# Patient Record
Sex: Female | Born: 1945 | Race: White | Hispanic: No | Marital: Married | Smoking: Never smoker
Health system: Southern US, Community
[De-identification: ages and names within clinical notes are randomized; demographics above are authoritative.]

## PROBLEM LIST (undated history)

## (undated) DIAGNOSIS — F329 Major depressive disorder, single episode, unspecified: Secondary | ICD-10-CM

## (undated) DIAGNOSIS — F32A Depression, unspecified: Secondary | ICD-10-CM

## (undated) DIAGNOSIS — T7840XA Allergy, unspecified, initial encounter: Secondary | ICD-10-CM

## (undated) DIAGNOSIS — E785 Hyperlipidemia, unspecified: Secondary | ICD-10-CM

## (undated) DIAGNOSIS — I10 Essential (primary) hypertension: Secondary | ICD-10-CM

## (undated) HISTORY — DX: Essential (primary) hypertension: I10

## (undated) HISTORY — DX: Depression, unspecified: F32.A

## (undated) HISTORY — DX: Hyperlipidemia, unspecified: E78.5

## (undated) HISTORY — DX: Allergy, unspecified, initial encounter: T78.40XA

## (undated) HISTORY — DX: Major depressive disorder, single episode, unspecified: F32.9

---

## 2006-12-18 HISTORY — PX: CATARACT EXTRACTION W/ INTRAOCULAR LENS  IMPLANT, BILATERAL: SHX1307

## 2008-01-10 ENCOUNTER — Encounter: Admission: RE | Admit: 2008-01-10 | Discharge: 2008-01-10 | Payer: Self-pay | Admitting: Internal Medicine

## 2008-01-14 ENCOUNTER — Ambulatory Visit: Payer: Self-pay | Admitting: Internal Medicine

## 2008-01-14 DIAGNOSIS — B351 Tinea unguium: Secondary | ICD-10-CM

## 2008-01-14 DIAGNOSIS — J069 Acute upper respiratory infection, unspecified: Secondary | ICD-10-CM | POA: Insufficient documentation

## 2008-01-14 DIAGNOSIS — I10 Essential (primary) hypertension: Secondary | ICD-10-CM

## 2008-01-14 DIAGNOSIS — N924 Excessive bleeding in the premenopausal period: Secondary | ICD-10-CM | POA: Insufficient documentation

## 2008-01-14 DIAGNOSIS — F4321 Adjustment disorder with depressed mood: Secondary | ICD-10-CM | POA: Insufficient documentation

## 2008-01-15 ENCOUNTER — Encounter (INDEPENDENT_AMBULATORY_CARE_PROVIDER_SITE_OTHER): Payer: Self-pay | Admitting: *Deleted

## 2008-03-04 ENCOUNTER — Ambulatory Visit: Payer: Self-pay | Admitting: Internal Medicine

## 2008-03-04 ENCOUNTER — Ambulatory Visit: Payer: Self-pay | Admitting: Gastroenterology

## 2008-03-09 ENCOUNTER — Encounter: Payer: Self-pay | Admitting: Internal Medicine

## 2008-03-17 LAB — CONVERTED CEMR LAB
AST: 16 units/L (ref 0–37)
Alkaline Phosphatase: 55 units/L (ref 39–117)
BUN: 19 mg/dL (ref 6–23)
Bilirubin, Direct: 0.1 mg/dL (ref 0.0–0.3)
CO2: 31 meq/L (ref 19–32)
Chloride: 106 meq/L (ref 96–112)
Eosinophils Relative: 3.3 % (ref 0.0–5.0)
GFR calc Af Amer: 73 mL/min
Glucose, Bld: 97 mg/dL (ref 70–99)
HDL: 33.6 mg/dL — ABNORMAL LOW (ref >39.0)
LDL Cholesterol: 102 mg/dL — ABNORMAL HIGH (ref 0–99)
Lymphocytes Relative: 32.9 % (ref 12.0–46.0)
Monocytes Absolute: 0.6 10*3/uL (ref 0.2–0.7)
Monocytes Relative: 7.5 % (ref 3.0–11.0)
Neutrophils Relative %: 55.7 % (ref 43.0–77.0)
Platelets: 246 10*3/uL (ref 150–400)
Potassium: 4.2 meq/L (ref 3.5–5.1)
RDW: 12.6 % (ref 11.5–14.6)
Sodium: 142 meq/L (ref 135–145)
Total CHOL/HDL Ratio: 5
Total Protein: 7.1 g/dL (ref 6.0–8.3)
Triglycerides: 159 mg/dL — ABNORMAL HIGH (ref 0–149)
VLDL: 32 mg/dL (ref 0–40)
WBC: 7.6 10*3/uL (ref 4.5–10.5)

## 2008-03-18 ENCOUNTER — Ambulatory Visit: Payer: Self-pay | Admitting: Gastroenterology

## 2008-04-14 ENCOUNTER — Ambulatory Visit: Payer: Self-pay | Admitting: Internal Medicine

## 2008-04-14 DIAGNOSIS — R635 Abnormal weight gain: Secondary | ICD-10-CM | POA: Insufficient documentation

## 2008-04-22 ENCOUNTER — Telehealth: Payer: Self-pay | Admitting: Internal Medicine

## 2008-05-25 ENCOUNTER — Ambulatory Visit: Payer: Self-pay | Admitting: Internal Medicine

## 2008-05-25 DIAGNOSIS — H6120 Impacted cerumen, unspecified ear: Secondary | ICD-10-CM

## 2008-05-25 DIAGNOSIS — H60399 Other infective otitis externa, unspecified ear: Secondary | ICD-10-CM | POA: Insufficient documentation

## 2008-09-15 ENCOUNTER — Telehealth: Payer: Self-pay | Admitting: Internal Medicine

## 2008-09-15 ENCOUNTER — Ambulatory Visit: Payer: Self-pay | Admitting: Internal Medicine

## 2008-09-15 DIAGNOSIS — E669 Obesity, unspecified: Secondary | ICD-10-CM

## 2008-09-21 LAB — CONVERTED CEMR LAB
BUN: 12 mg/dL (ref 6–23)
CO2: 26 meq/L (ref 19–32)
Calcium: 9.5 mg/dL (ref 8.4–10.5)
Glucose, Bld: 87 mg/dL (ref 70–99)
Sodium: 141 meq/L (ref 135–145)

## 2009-01-11 ENCOUNTER — Encounter: Admission: RE | Admit: 2009-01-11 | Discharge: 2009-01-11 | Payer: Self-pay | Admitting: Obstetrics and Gynecology

## 2009-01-14 ENCOUNTER — Encounter: Admission: RE | Admit: 2009-01-14 | Discharge: 2009-01-14 | Payer: Self-pay | Admitting: Obstetrics and Gynecology

## 2009-03-16 ENCOUNTER — Ambulatory Visit: Payer: Self-pay | Admitting: Internal Medicine

## 2009-03-16 LAB — CONVERTED CEMR LAB
CO2: 30 meq/L (ref 19–32)
Chloride: 110 meq/L (ref 96–112)
Creatinine, Ser: 0.8 mg/dL (ref 0.4–1.2)
Glucose, Bld: 91 mg/dL (ref 70–99)
Sodium: 145 meq/L (ref 135–145)
TSH: 2.73 microintl units/mL (ref 0.35–5.50)

## 2009-09-20 ENCOUNTER — Ambulatory Visit: Payer: Self-pay | Admitting: Internal Medicine

## 2009-09-21 LAB — CONVERTED CEMR LAB
ALT: 15 units/L (ref 0–35)
AST: 17 units/L (ref 0–37)
Albumin: 4.1 g/dL (ref 3.5–5.2)
Alkaline Phosphatase: 59 units/L (ref 39–117)
BUN: 16 mg/dL (ref 6–23)
Bilirubin, Direct: 0.1 mg/dL (ref 0.0–0.3)
CO2: 28 meq/L (ref 19–32)
Calcium: 9.4 mg/dL (ref 8.4–10.5)
Chloride: 110 meq/L (ref 96–112)
Creatinine, Ser: 0.9 mg/dL (ref 0.4–1.2)
GFR calc non Af Amer: 67.34 mL/min (ref >60)
Glucose, Bld: 88 mg/dL (ref 70–99)
Potassium: 4.3 meq/L (ref 3.5–5.1)
Sodium: 143 meq/L (ref 135–145)
TSH: 1.78 microintl units/mL (ref 0.35–5.50)
Total Bilirubin: 1.1 mg/dL (ref 0.3–1.2)
Total Protein: 8.4 g/dL — ABNORMAL HIGH (ref 6.0–8.3)

## 2009-10-04 ENCOUNTER — Telehealth: Payer: Self-pay | Admitting: Internal Medicine

## 2010-01-17 ENCOUNTER — Encounter: Admission: RE | Admit: 2010-01-17 | Discharge: 2010-01-17 | Payer: Self-pay | Admitting: Internal Medicine

## 2010-03-21 ENCOUNTER — Ambulatory Visit: Payer: Self-pay | Admitting: Internal Medicine

## 2010-09-13 ENCOUNTER — Ambulatory Visit: Payer: Self-pay | Admitting: Internal Medicine

## 2010-09-13 LAB — CONVERTED CEMR LAB
ALT: 14 units/L (ref 0–35)
BUN: 20 mg/dL (ref 6–23)
Bilirubin Urine: NEGATIVE
CO2: 27 meq/L (ref 19–32)
Chloride: 108 meq/L (ref 96–112)
Eosinophils Relative: 2.6 % (ref 0.0–5.0)
Glucose, Bld: 87 mg/dL (ref 70–99)
HCT: 40.5 % (ref 36.0–46.0)
Hemoglobin, Urine: NEGATIVE
Leukocytes, UA: NEGATIVE
Lymphs Abs: 2.2 10*3/uL (ref 0.7–4.0)
MCV: 94.3 fL (ref 78.0–100.0)
Monocytes Absolute: 0.6 10*3/uL (ref 0.1–1.0)
Nitrite: NEGATIVE
Platelets: 240 10*3/uL (ref 150.0–400.0)
Potassium: 4.7 meq/L (ref 3.5–5.1)
RDW: 13.3 % (ref 11.5–14.6)
TSH: 2.46 microintl units/mL (ref 0.35–5.50)
Total Bilirubin: 0.8 mg/dL (ref 0.3–1.2)
Urobilinogen, UA: 0.2 (ref 0.0–1.0)
WBC: 8.4 10*3/uL (ref 4.5–10.5)
pH: 6 (ref 5.0–8.0)

## 2010-09-20 ENCOUNTER — Encounter: Payer: Self-pay | Admitting: Internal Medicine

## 2010-09-20 ENCOUNTER — Ambulatory Visit: Payer: Self-pay | Admitting: Internal Medicine

## 2010-12-15 ENCOUNTER — Ambulatory Visit: Payer: Self-pay | Admitting: Internal Medicine

## 2010-12-17 LAB — CONVERTED CEMR LAB
BUN: 15 mg/dL (ref 6–23)
Calcium: 9 mg/dL (ref 8.4–10.5)
Cholesterol: 161 mg/dL (ref 0–200)
Creatinine, Ser: 0.9 mg/dL (ref 0.4–1.2)
GFR calc non Af Amer: 66.22 mL/min (ref >60.00)
LDL Cholesterol: 115 mg/dL — ABNORMAL HIGH (ref 0–99)
Potassium: 4.2 meq/L (ref 3.5–5.1)
Total Bilirubin: 0.8 mg/dL (ref 0.3–1.2)
Triglycerides: 72 mg/dL (ref 0.0–149.0)
VLDL: 14.4 mg/dL (ref 0.0–40.0)

## 2010-12-22 ENCOUNTER — Ambulatory Visit
Admission: RE | Admit: 2010-12-22 | Discharge: 2010-12-22 | Payer: Self-pay | Source: Home / Self Care | Attending: Internal Medicine | Admitting: Internal Medicine

## 2011-01-07 ENCOUNTER — Other Ambulatory Visit: Payer: Self-pay | Admitting: Obstetrics and Gynecology

## 2011-01-07 DIAGNOSIS — Z0000 Encounter for general adult medical examination without abnormal findings: Secondary | ICD-10-CM

## 2011-01-17 NOTE — Assessment & Plan Note (Signed)
Summary: 6 MTH FU  STC   Vital Signs:  Patient profile:   65 year old female Height:      66 inches Weight:      190 pounds BMI:     30.78 Temp:     98.1 degrees F oral Pulse rate:   64 / minute BP sitting:   132 / 88  (left arm)  Vitals Entered By: Lamar Sprinkles, CMA (March 21, 2010 8:12 AM) CC: 6 mth f/u    CC:  6 mth f/u .  History of Present Illness: F/u HTN  Current Medications (verified): 1)  Norvasc 5 Mg Tabs (Amlodipine Besylate) .Marland Kitchen.. 1 Tab  By Mouth Once Daily 2)  Penlac 8 %  Soln (Ciclopirox) .... Use Once Daily X 12 Mo 3)  Ecotrin 325 Mg Tbec (Aspirin) .... Once Daily 4)  Ketoconazole 2 % Crea (Ketoconazole) .... Apply To Foot Qd  Allergies (verified): 1)  ! Penicillin V Potassium (Penicillin V Potassium) 2)  ! 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    Physical Exam  General:  Well-developed,well-nourished,in no acute distress; alert,appropriate and cooperative throughout examination Mouth:  Oral mucosa and oropharynx without lesions or exudates.  Teeth in good repair. Lungs:  Normal respiratory effort, chest expands symmetrically. Lungs are clear to auscultation, no crackles or wheezes. Heart:  Normal rate and regular rhythm. S1 and S2 normal without gallop, murmur, click, rub or other extra sounds. Abdomen:  Bowel sounds positive,abdomen soft and non-tender without masses, organomegaly or hernias noted. Msk:  No deformity or scoliosis noted of thoracic or lumbar spine.   Extremities:  No clubbing, cyanosis, edema, or deformity noted with normal full range of motion of all joints.   Neurologic:  No cranial nerve deficits noted. Station and gait are normal. Plantar reflexes are  down-going bilaterally. DTRs are symmetrical throughout. Sensory, motor and coordinative functions appear intact.   Impression & Recommendations:  Problem # 1:  HYPERTENSION (ICD-401.9) Assessment Improved Check BP at home. Call me if up Her updated medication list for this problem includes:    Norvasc 5 Mg Tabs (Amlodipine besylate) .Marland Kitchen... 1 tab  by mouth once daily  Problem # 2:  MENOPAUSAL SYNDROME (ICD-627.0) Assessment: Improved  Complete Medication List: 1)  Norvasc 5 Mg Tabs (Amlodipine besylate) .Marland Kitchen.. 1 tab  by mouth once daily 2)  Penlac 8 % Soln (Ciclopirox) .... Use once daily x 12 mo 3)  Ecotrin 325 Mg Tbec (Aspirin) .... Once daily 4)  Ketoconazole 2 % Crea (Ketoconazole) .... Apply to foot qd 5)  Vitamin D 1000 Unit Tabs (Cholecalciferol) .Marland Kitchen.. 1 by mouth qd  Patient Instructions: 1)  Please schedule a follow-up appointment in 6 months well w/labs.

## 2011-01-17 NOTE — Assessment & Plan Note (Signed)
Summary: 6 MTH PHYSICAL--STC   Vital Signs:  Patient profile:   65 year old female Height:      66 inches Weight:      188 pounds BMI:     30.45 Temp:     97.4 degrees F oral Pulse rate:   64 / minute Pulse rhythm:   regular BP sitting:   140 / 90  (left arm) Cuff size:   regular  Vitals Entered By: Lanier Prude, Beverly Gust) (September 20, 2010 8:32 AM) CC: CPX Is Patient Diabetic? No Comments pt is not taking Vit D   CC:  CPX.  History of Present Illness: The patient presents for a preventive health examination  C/o R earrache x 2 wks C/o URI  Current Medications (verified): 1)  Norvasc 5 Mg Tabs (Amlodipine Besylate) .Marland Kitchen.. 1 Tab  By Mouth Once Daily 2)  Penlac 8 %  Soln (Ciclopirox) .... Use Once Daily X 12 Mo 3)  Ecotrin 325 Mg Tbec (Aspirin) .... Once Daily 4)  Ketoconazole 2 % Crea (Ketoconazole) .... Apply To Foot Qd 5)  Vitamin D 1000 Unit Tabs (Cholecalciferol) .Marland Kitchen.. 1 By Mouth Qd  Allergies (verified): 1)  ! Penicillin V Potassium (Penicillin V Potassium) 2)  ! Sulfadiazine (Sulfadiazine)  Past History:  Past Surgical History: Last updated: 01/14/2008 B lense impl. 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         Sweats  Physical Exam  General:  Well-developed,well-nourished,in no acute distress; alert,appropriate and cooperative throughout examination Head:  Normocephalic and atraumatic without obvious abnormalities. No apparent alopecia or balding. Eyes:  No corneal or conjunctival inflammation noted. EOMI. Perrla. Funduscopic exam benign, without hemorrhages, exudates or papilledema. Vision grossly normal. Ears:  L nl R wax Mouth:  Erythematous throat and intranasal mucosa c/w URI   Neck:  No deformities, masses, or tenderness noted. Lungs:  Normal respiratory effort, chest expands symmetrically. Lungs are clear to auscultation, no crackles or wheezes. Heart:  Normal rate and regular rhythm. S1 and S2 normal without gallop, murmur, click, rub or other extra sounds. Abdomen:  Bowel sounds positive,abdomen soft and non-tender without masses, organomegaly or hernias noted. Msk:  No deformity or scoliosis noted of thoracic or lumbar spine.   Pulses:  R and L carotid,radial,femoral,dorsalis pedis and posterior tibial pulses are full and equal bilaterally Extremities:  No clubbing, cyanosis, edema, or deformity noted with normal full range of motion of all joints.   Neurologic:  No cranial nerve deficits noted. Station and gait are normal. Plantar reflexes are down-going bilaterally. DTRs are symmetrical throughout. Sensory, motor and coordinative functions appear intact. Skin:  Intact without suspicious lesions or rashes Cervical Nodes:  No lymphadenopathy noted Inguinal Nodes:  No significant adenopathy Psych:  Cognition and judgment appear intact. Alert and cooperative with normal attention span and concentration. No apparent delusions, illusions, hallucinations   Impression & Recommendations:  Problem # 1:  WELL ADULT EXAM (ICD-V70.0) Assessment New Health and age related issues were discussed. Available screening tests and vaccinations were discussed as well. Healthy life style  including good diet and exercise was discussed.  The labs were reviewed with the patient.  Orders: EKG w/ Interpretation (93000)OK  Problem # 2:  HYPERTENSION (ICD-401.9) Assessment: Deteriorated  Her updated medication list for this problem includes:    Norvasc 5 Mg Tabs (Amlodipine besylate) .Marland Kitchen... 1 tab  by mouth once daily    Losartan Potassium 100 Mg Tabs (Losartan potassium) .Marland Kitchen... 1 by mouth once daily for blood pressure  Problem # 3:  MENOPAUSAL SYNDROME (ICD-627.0) Assessment: Unchanged  Problem # 4:  CERUMEN IMPACTION (ICD-380.4) Assessment: Comment Only She will irrigate at home  Problem # 5:  URI Assessment: New Zpac if worse  Complete Medication List: 1)  Norvasc 5 Mg Tabs (Amlodipine besylate) .Marland Kitchen.. 1 tab  by mouth once daily 2)  Penlac 8 % Soln (Ciclopirox) .... Use once daily x 12 mo 3)  Ecotrin 325 Mg Tbec (Aspirin) .... Once daily 4)  Ketoconazole 2 % Crea (Ketoconazole) .... Apply to foot qd 5)  Vitamin D 1000 Unit Tabs (Cholecalciferol) .Marland Kitchen.. 1 by mouth qd 6)  Lipitor 10 Mg Tabs (Atorvastatin calcium) .Marland Kitchen.. 1 by mouth once daily for cholesterol 7)  Zithromax Z-pak 250 Mg Tabs (Azithromycin) .... As dirrected 8)  Cortisporin 3.5-10000-1 Soln (Neomycin-polymyxin-hc) .... 3 gtt in r ear tid 9)  Losartan Potassium 100 Mg Tabs (Losartan potassium) .Marland Kitchen.. 1 by mouth once daily for blood pressure  Patient Instructions: 1)  Please schedule a follow-up appointment in 3 months. 2)  BMP prior to visit, ICD-9: 3)  Hepatic Panel prior to visit, ICD-9:272.0 401.1 4)  Lipid Panel prior to visit, ICD-9: Prescriptions: LOSARTAN POTASSIUM 100 MG TABS (LOSARTAN POTASSIUM) 1 by mouth once daily for blood pressure  #30 x 11   Entered and Authorized by:   Tresa Garter MD   Signed by:   Tresa Garter MD on 09/20/2010   Method used:   Print then Give to Patient   RxID:   [id number] CORTISPORIN 3.5-10000-1 SOLN (NEOMYCIN-POLYMYXIN-HC) 3 gtt in R ear tid  #1 x 1    Entered and Authorized by:   Tresa Garter MD   Signed by:   Tresa Garter MD on 09/20/2010   Method used:   Print then Give to Patient   RxID:   [id number] Z-PAK 250 MG TABS (AZITHROMYCIN) as dirrected  #1 x 0   Entered and Authorized by:   Tresa Garter MD   Signed by:   Tresa Garter MD on 09/20/2010   Method used:   Print then Give to Patient   RxID:   [id number] LIPITOR 10 MG TABS (ATORVASTATIN CALCIUM) 1 by mouth once daily for cholesterol  #30 x 12   Entered and Authorized by:   Tresa Garter MD   Signed by:   Tresa Garter MD on 09/20/2010   Method used:   Print then Give to Patient   RxID:   [id number]

## 2011-01-18 ENCOUNTER — Ambulatory Visit
Admission: RE | Admit: 2011-01-18 | Discharge: 2011-01-18 | Disposition: A | Discharge status: Home / Self Care | Payer: 59 | Source: Ambulatory Visit | Attending: Obstetrics and Gynecology | Admitting: Obstetrics and Gynecology

## 2011-01-18 DIAGNOSIS — Z0000 Encounter for general adult medical examination without abnormal findings: Secondary | ICD-10-CM

## 2011-01-19 NOTE — Assessment & Plan Note (Signed)
Summary: 3 MO ROV /NWS  #   Vital Signs:  Patient profile:   65 year old female Height:      66 inches Weight:      160 pounds BMI:     25.92 Temp:     98.3 degrees F oral Pulse rate:   72 / minute Pulse rhythm:   regular Resp:     16 per minute BP sitting:   124 / 80  (left arm) Cuff size:   regular  Vitals Entered By: Lanier Prude, CMA(AAMA) (December 22, 2010 8:44 AM) CC: 3 mo f/u  Is Patient Diabetic? No Comments pt is not taking Lipitor or Losartan   CC:  3 mo f/u .  History of Present Illness: The patient presents for a follow up of hypertension, obesity, hyperlipidemia   Current Medications (verified): 1)  Norvasc 5 Mg Tabs (Amlodipine Besylate) .Marland Kitchen.. 1 Tab  By Mouth Once Daily 2)  Penlac 8 %  Soln (Ciclopirox) .... Use Once Daily X 12 Mo 3)  Ecotrin 325 Mg Tbec (Aspirin) .... Once Daily 4)  Ketoconazole 2 % Crea (Ketoconazole) .... Apply To Foot Qd 5)  Vitamin D 1000 Unit Tabs (Cholecalciferol) .Marland Kitchen.. 1 By Mouth Qd 6)  Lipitor 10 Mg Tabs (Atorvastatin Calcium) .Marland Kitchen.. 1 By Mouth Once Daily For Cholesterol 7)  Losartan Potassium 100 Mg Tabs (Losartan Potassium) .Marland Kitchen.. 1 By Mouth Once Daily For Blood Pressure  Allergies (verified): 1)  ! Penicillin V Potassium (Penicillin V Potassium) 2)  ! 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         lost wt on diet  Physical Exam  General:  Well-developed,well-nourished,in no acute distress; alert,appropriate and cooperative throughout examination Mouth:  Erythematous throat and intranasal mucosa c/w URI   Neck:  No deformities, masses, or tenderness noted. Lungs:  Normal respiratory effort, chest expands symmetrically. Lungs are  clear to auscultation, no crackles or wheezes. Heart:  Normal rate and regular rhythm. S1 and S2 normal without gallop, murmur, click, rub or other extra sounds. Abdomen:  Bowel sounds positive,abdomen soft and non-tender without masses, organomegaly or hernias noted. Msk:  No deformity or scoliosis noted of thoracic or lumbar spine.   Extremities:  No clubbing, cyanosis, edema, or deformity noted with normal full range of motion of all joints.   Neurologic:  No cranial nerve deficits noted. Station and gait are normal. Plantar reflexes are down-going bilaterally. DTRs are symmetrical throughout. Sensory, motor and coordinative functions appear intact. Skin:  Intact without suspicious lesions or rashes Psych:  Cognition and judgment appear intact. Alert and cooperative with normal attention span and concentration. No apparent delusions, illusions, hallucinations   Impression & Recommendations:  Problem # 1:  HYPERLIPIDEMIA (ICD-272.4) Assessment Improved  Her updated medication list for this problem includes:    Lipitor 10 Mg Tabs (Atorvastatin calcium) .Marland Kitchen... 1 by mouth once daily for cholesterol  Problem # 2:  DEPRESSION (ICD-311) Assessment: Improved  Problem # 3:  OBESITY (ICD-278.00) Assessment: Improved Cont w/good diet  Complete Medication List: 1)  Norvasc 5 Mg Tabs (Amlodipine besylate) .Marland Kitchen.. 1 tab  by mouth once daily 2)  Penlac 8 % Soln (Ciclopirox) .... Use once daily x 12 mo 3)  Ecotrin 325 Mg Tbec (  Aspirin) .... Once daily 4)  Ketoconazole 2 % Crea (Ketoconazole) .... Apply to foot qd 5)  Vitamin D 1000 Unit Tabs (Cholecalciferol) .Marland Kitchen.. 1 by mouth qd 6)  Lipitor 10 Mg Tabs (Atorvastatin calcium) .Marland Kitchen.. 1 by mouth once daily for cholesterol  Other Orders: Flu Vaccine 36yrs + (40981) Admin 1st Vaccine (19147)  Patient Instructions: 1)  Please schedule a follow-up appointment in 6 months  2)  BMP prior to visit, ICD-9: 3)  Hepatic Panel prior to visit, ICD-9: 4)  Lipid  Panel prior to visit, ICD-9:272.0 995.20   Orders Added: 1)  Flu Vaccine 46yrs + [90658] 2)  Admin 1st Vaccine [90471] 3)  Est. Patient Level III [82956]   Immunizations Administered:  Influenza Vaccine # 1:    Vaccine Type: Fluvax 3+    Site: left deltoid    Mfr: Sanofi Pasteur    Dose: 0.5 ml    Route: IM    Given by: Lanier Prude, CMA(AAMA)    Exp. Date: 06/17/2011    Lot #: [id number]    VIS given: 07/12/10 version given December 22, 2010.   Immunizations Administered:  Influenza Vaccine # 1:    Vaccine Type: Fluvax 3+    Site: left deltoid    Mfr: Sanofi Pasteur    Dose: 0.5 ml    Route: IM    Given by: Lanier Prude, CMA(AAMA)    Exp. Date: 06/17/2011    Lot #: [id number]    VIS given: 07/12/10 version given December 22, 2010.

## 2011-04-10 ENCOUNTER — Other Ambulatory Visit: Payer: Self-pay | Admitting: Internal Medicine

## 2011-04-11 ENCOUNTER — Other Ambulatory Visit: Payer: Self-pay | Admitting: *Deleted

## 2011-04-11 MED ORDER — AMLODIPINE BESYLATE 5 MG PO TABS: 5.0000 mg | ORAL_TABLET | Freq: Every day | ORAL | Status: DC

## 2011-09-21 ENCOUNTER — Encounter: Payer: Self-pay | Admitting: Internal Medicine

## 2011-09-25 ENCOUNTER — Telehealth: Payer: Self-pay | Admitting: *Deleted

## 2011-09-25 DIAGNOSIS — E669 Obesity, unspecified: Secondary | ICD-10-CM

## 2011-09-25 DIAGNOSIS — R635 Abnormal weight gain: Secondary | ICD-10-CM

## 2011-09-25 DIAGNOSIS — E785 Hyperlipidemia, unspecified: Secondary | ICD-10-CM

## 2011-09-25 DIAGNOSIS — I10 Essential (primary) hypertension: Secondary | ICD-10-CM

## 2011-09-25 DIAGNOSIS — Z0000 Encounter for general adult medical examination without abnormal findings: Secondary | ICD-10-CM

## 2011-09-25 NOTE — Telephone Encounter (Signed)
Patient requesting labs prior to OV this Friday.

## 2011-09-25 NOTE — Telephone Encounter (Signed)
CBC, CMET, lipids, TSH Thx

## 2011-09-25 NOTE — Telephone Encounter (Signed)
Labs ordered,  Patient informed

## 2011-09-26 ENCOUNTER — Other Ambulatory Visit (INDEPENDENT_AMBULATORY_CARE_PROVIDER_SITE_OTHER): Payer: 59

## 2011-09-26 DIAGNOSIS — I10 Essential (primary) hypertension: Secondary | ICD-10-CM

## 2011-09-26 DIAGNOSIS — E785 Hyperlipidemia, unspecified: Secondary | ICD-10-CM

## 2011-09-26 DIAGNOSIS — R635 Abnormal weight gain: Secondary | ICD-10-CM

## 2011-09-26 DIAGNOSIS — Z0000 Encounter for general adult medical examination without abnormal findings: Secondary | ICD-10-CM

## 2011-09-26 DIAGNOSIS — E669 Obesity, unspecified: Secondary | ICD-10-CM

## 2011-09-26 LAB — HEPATIC FUNCTION PANEL
AST: 19 U/L (ref 0–37)
Total Bilirubin: 1.1 mg/dL (ref 0.3–1.2)

## 2011-09-26 LAB — LIPID PANEL
Cholesterol: 179 mg/dL (ref 0–200)
HDL: 58.7 mg/dL (ref >39.00)
LDL Cholesterol: 109 mg/dL — ABNORMAL HIGH (ref 0–99)
VLDL: 11.8 mg/dL (ref 0.0–40.0)

## 2011-09-26 LAB — CBC WITH DIFFERENTIAL/PLATELET
Basophils Relative: 0.5 % (ref 0.0–3.0)
Eosinophils Absolute: 0.2 10*3/uL (ref 0.0–0.7)
HCT: 40.3 % (ref 36.0–46.0)
Lymphs Abs: 1.9 10*3/uL (ref 0.7–4.0)
MCHC: 32.5 g/dL (ref 30.0–36.0)
MCV: 97.8 fl (ref 78.0–100.0)
Monocytes Absolute: 0.4 10*3/uL (ref 0.1–1.0)
Neutrophils Relative %: 57.9 % (ref 43.0–77.0)
Platelets: 183 10*3/uL (ref 150.0–400.0)

## 2011-09-26 LAB — BASIC METABOLIC PANEL
BUN: 24 mg/dL — ABNORMAL HIGH (ref 6–23)
Calcium: 9.3 mg/dL (ref 8.4–10.5)
GFR: 70.51 mL/min (ref >60.00)
Potassium: 4.6 mEq/L (ref 3.5–5.1)
Sodium: 143 mEq/L (ref 135–145)

## 2011-09-29 ENCOUNTER — Ambulatory Visit (INDEPENDENT_AMBULATORY_CARE_PROVIDER_SITE_OTHER): Payer: 59 | Admitting: Internal Medicine

## 2011-09-29 ENCOUNTER — Encounter: Payer: Self-pay | Admitting: Internal Medicine

## 2011-09-29 DIAGNOSIS — I10 Essential (primary) hypertension: Secondary | ICD-10-CM

## 2011-09-29 DIAGNOSIS — E785 Hyperlipidemia, unspecified: Secondary | ICD-10-CM

## 2011-09-29 DIAGNOSIS — F329 Major depressive disorder, single episode, unspecified: Secondary | ICD-10-CM

## 2011-09-29 DIAGNOSIS — B351 Tinea unguium: Secondary | ICD-10-CM

## 2011-09-29 MED ORDER — CICLOPIROX 8 % EX SOLN: Freq: Every day | CUTANEOUS | Status: DC

## 2011-09-29 MED ORDER — KETOCONAZOLE 2 % EX CREA: TOPICAL_CREAM | CUTANEOUS | Status: DC | PRN

## 2011-09-29 NOTE — Assessment & Plan Note (Signed)
Controlled Continue with current prescription therapy as reflected on the Med list.  

## 2011-09-29 NOTE — Assessment & Plan Note (Signed)
Continue with current prescription therapy as reflected on the Med list.  

## 2011-09-29 NOTE — Progress Notes (Signed)
  Subjective:    Patient ID: Vanessa Hardy, female    DOB: 1946-04-21, 65 y.o.   MRN: [id number]  HPI  The patient presents for a follow-up of  chronic hypertension, chronic dyslipidemia, controlled with medicines    Review of Systems  Constitutional: Negative for chills, activity change, appetite change, fatigue and unexpected weight change.  HENT: Negative for congestion, mouth sores and sinus pressure.   Eyes: Negative for visual disturbance.  Respiratory: Negative for cough and chest tightness.   Gastrointestinal: Negative for nausea and abdominal pain.  Genitourinary: Negative for frequency, difficulty urinating and vaginal pain.  Musculoskeletal: Negative for back pain and gait problem.  Skin: Negative for pallor and rash.  Neurological: Negative for dizziness, tremors, weakness, numbness and headaches.  Psychiatric/Behavioral: Negative for confusion and sleep disturbance.       Objective:   Physical Exam  Constitutional: She appears well-developed and well-nourished. No distress.  HENT:  Head: Normocephalic.  Right Ear: External ear normal.  Left Ear: External ear normal.  Nose: Nose normal.  Mouth/Throat: Oropharynx is clear and moist.  Eyes: Conjunctivae are normal. Pupils are equal, round, and reactive to light. Right eye exhibits no discharge. Left eye exhibits no discharge.  Neck: Normal range of motion. Neck supple. No JVD present. No tracheal deviation present. No thyromegaly present.  Cardiovascular: Normal rate, regular rhythm and normal heart sounds.   Pulmonary/Chest: No stridor. No respiratory distress. She has no wheezes.  Abdominal: Soft. Bowel sounds are normal. She exhibits no distension and no mass. There is no tenderness. There is no rebound and no guarding.  Musculoskeletal: She exhibits no edema and no tenderness.  Lymphadenopathy:    She has no cervical adenopathy.  Neurological: She displays normal reflexes. No cranial nerve deficit. She exhibits  normal muscle tone. Coordination normal.  Skin: No rash noted. No erythema.  Psychiatric: Her behavior is normal. Judgment and thought content normal.       anxious  Onycho B feet and scaly skin  Lab Results  Component Value Date   WBC 5.9 09/26/2011   HGB 13.1 09/26/2011   HCT 40.3 09/26/2011   PLT 183.0 09/26/2011   GLUCOSE 75 09/26/2011   CHOL 179 09/26/2011   TRIG 59.0 09/26/2011   HDL 58.70 09/26/2011   LDLDIRECT 159.4 09/13/2010   LDLCALC 109* 09/26/2011   ALT 15 09/26/2011   AST 19 09/26/2011   NA 143 09/26/2011   K 4.6 09/26/2011   CL 109 09/26/2011   CREATININE 0.9 09/26/2011   BUN 24* 09/26/2011   CO2 28 09/26/2011   TSH 2.92 09/26/2011        Assessment & Plan:

## 2011-10-03 ENCOUNTER — Ambulatory Visit: Payer: Self-pay | Admitting: Internal Medicine

## 2011-10-05 ENCOUNTER — Encounter: Payer: Self-pay | Admitting: Internal Medicine

## 2011-12-18 ENCOUNTER — Other Ambulatory Visit: Payer: Self-pay | Admitting: Obstetrics and Gynecology

## 2011-12-18 DIAGNOSIS — Z1231 Encounter for screening mammogram for malignant neoplasm of breast: Secondary | ICD-10-CM

## 2012-01-22 ENCOUNTER — Ambulatory Visit
Admission: RE | Admit: 2012-01-22 | Discharge: 2012-01-22 | Disposition: A | Discharge status: Home / Self Care | Payer: 59 | Source: Ambulatory Visit | Attending: Obstetrics and Gynecology | Admitting: Obstetrics and Gynecology

## 2012-01-22 DIAGNOSIS — Z1231 Encounter for screening mammogram for malignant neoplasm of breast: Secondary | ICD-10-CM

## 2012-02-12 ENCOUNTER — Telehealth: Payer: Self-pay | Admitting: Internal Medicine

## 2012-02-12 NOTE — Telephone Encounter (Signed)
The pt has asked to be worked in for a 6 month follow up.  She neglected to schedule the last time she was here and did not call.  She has been advised of your next available apt, but states that far out was "unacceptable".  Please advise if she can be worked in.   Thanks!

## 2012-02-12 NOTE — Telephone Encounter (Signed)
Call pt and lvmom to schedule

## 2012-02-12 NOTE — Telephone Encounter (Signed)
Ok to work in soon [employer]

## 2012-02-13 ENCOUNTER — Ambulatory Visit: Payer: 59 | Admitting: Internal Medicine

## 2012-02-21 ENCOUNTER — Telehealth: Payer: Self-pay | Admitting: *Deleted

## 2012-02-21 NOTE — Telephone Encounter (Signed)
Left mess for patient to call back.   Pt left vm req labs be put in for tom AM for her f/u next week. I don't see where labs are needed and Dr. Posey Rea is out of office until Monday.

## 2012-02-27 ENCOUNTER — Encounter: Payer: Self-pay | Admitting: Internal Medicine

## 2012-02-27 ENCOUNTER — Ambulatory Visit (INDEPENDENT_AMBULATORY_CARE_PROVIDER_SITE_OTHER): Payer: 59 | Admitting: Internal Medicine

## 2012-02-27 ENCOUNTER — Other Ambulatory Visit (INDEPENDENT_AMBULATORY_CARE_PROVIDER_SITE_OTHER): Payer: 59

## 2012-02-27 DIAGNOSIS — I10 Essential (primary) hypertension: Secondary | ICD-10-CM

## 2012-02-27 DIAGNOSIS — R635 Abnormal weight gain: Secondary | ICD-10-CM

## 2012-02-27 DIAGNOSIS — E785 Hyperlipidemia, unspecified: Secondary | ICD-10-CM

## 2012-02-27 LAB — LIPID PANEL
LDL Cholesterol: 109 mg/dL — ABNORMAL HIGH (ref 0–99)
Total CHOL/HDL Ratio: 3
Triglycerides: 56 mg/dL (ref 0.0–149.0)

## 2012-02-27 LAB — TSH: TSH: 1.8 u[IU]/mL (ref 0.35–5.50)

## 2012-02-27 LAB — BASIC METABOLIC PANEL
BUN: 18 mg/dL (ref 6–23)
CO2: 27 mEq/L (ref 19–32)
Calcium: 9.3 mg/dL (ref 8.4–10.5)
Chloride: 107 mEq/L (ref 96–112)
Creatinine, Ser: 0.9 mg/dL (ref 0.4–1.2)
Glucose, Bld: 89 mg/dL (ref 70–99)

## 2012-02-27 LAB — HEPATIC FUNCTION PANEL
AST: 18 U/L (ref 0–37)
Alkaline Phosphatase: 39 U/L (ref 39–117)
Bilirubin, Direct: 0.1 mg/dL (ref 0.0–0.3)
Total Bilirubin: 0.5 mg/dL (ref 0.3–1.2)

## 2012-02-27 MED ORDER — KETOCONAZOLE 2 % EX CREA: TOPICAL_CREAM | CUTANEOUS | Status: DC | PRN

## 2012-02-27 MED ORDER — AMLODIPINE BESYLATE 5 MG PO TABS: 5.0000 mg | ORAL_TABLET | Freq: Every day | ORAL | Status: DC

## 2012-02-27 NOTE — Patient Instructions (Signed)
Normal BP<130/85 

## 2012-02-27 NOTE — Progress Notes (Signed)
Patient ID: Vanessa Hardy, female   DOB: 1946-03-05, 66 y.o.   MRN: [id number]  Subjective:    Patient ID: Vanessa Hardy, female    DOB: 1946-07-17, 66 y.o.   MRN: [id number]  Hypertension Pertinent negatives include no headaches.    The patient presents for a follow-up of  chronic hypertension, chronic dyslipidemia, controlled with medicines/diet F/u stress  BP Readings from Last 3 Encounters:  02/27/12 140/90  09/29/11 120/60  12/22/10 124/80   Wt Readings from Last 3 Encounters:  02/27/12 152 lb (68.947 kg)  09/29/11 144 lb (65.318 kg)  12/22/10 160 lb (72.576 kg)        Review of Systems  Constitutional: Negative for chills, activity change, appetite change, fatigue and unexpected weight change.  HENT: Negative for congestion, mouth sores and sinus pressure.   Eyes: Negative for visual disturbance.  Respiratory: Negative for cough and chest tightness.   Gastrointestinal: Negative for nausea and abdominal pain.  Genitourinary: Negative for frequency, difficulty urinating and vaginal pain.  Musculoskeletal: Negative for back pain and gait problem.  Skin: Negative for pallor and rash.  Neurological: Negative for dizziness, tremors, weakness, numbness and headaches.  Psychiatric/Behavioral: Negative for confusion and sleep disturbance.       Objective:   Physical Exam  Constitutional: She appears well-developed and well-nourished. No distress.  HENT:  Head: Normocephalic.  Right Ear: External ear normal.  Left Ear: External ear normal.  Nose: Nose normal.  Mouth/Throat: Oropharynx is clear and moist.  Eyes: Conjunctivae are normal. Pupils are equal, round, and reactive to light. Right eye exhibits no discharge. Left eye exhibits no discharge.  Neck: Normal range of motion. Neck supple. No JVD present. No tracheal deviation present. No thyromegaly present.  Cardiovascular: Normal rate, regular rhythm and normal heart sounds.   Pulmonary/Chest: No stridor. No  respiratory distress. She has no wheezes.  Abdominal: Soft. Bowel sounds are normal. She exhibits no distension and no mass. There is no tenderness. There is no rebound and no guarding.  Musculoskeletal: She exhibits no edema and no tenderness.  Lymphadenopathy:    She has no cervical adenopathy.  Neurological: She displays normal reflexes. No cranial nerve deficit. She exhibits normal muscle tone. Coordination normal.  Skin: No rash noted. No erythema.  Psychiatric: Her behavior is normal. Judgment and thought content normal.       anxious  Onycho B feet and scaly skin  Lab Results  Component Value Date   WBC 5.9 09/26/2011   HGB 13.1 09/26/2011   HCT 40.3 09/26/2011   PLT 183.0 09/26/2011   GLUCOSE 75 09/26/2011   CHOL 179 09/26/2011   TRIG 59.0 09/26/2011   HDL 58.70 09/26/2011   LDLDIRECT 159.4 09/13/2010   LDLCALC 109* 09/26/2011   ALT 15 09/26/2011   AST 19 09/26/2011   NA 143 09/26/2011   K 4.6 09/26/2011   CL 109 09/26/2011   CREATININE 0.9 09/26/2011   BUN 24* 09/26/2011   CO2 28 09/26/2011   TSH 2.92 09/26/2011        Assessment & Plan:

## 2012-02-27 NOTE — Assessment & Plan Note (Signed)
Continue with current prescription therapy as reflected on the Med list.  

## 2012-02-28 ENCOUNTER — Telehealth: Payer: Self-pay | Admitting: Internal Medicine

## 2012-02-28 NOTE — Telephone Encounter (Signed)
Stacey, please, inform patient that all labs are normal  Please, mail the labs to the patient.     

## 2012-02-29 NOTE — Telephone Encounter (Signed)
Pt notified and copy of labs mailed to pt.

## 2012-09-13 ENCOUNTER — Encounter: Payer: Self-pay | Admitting: Internal Medicine

## 2012-09-13 ENCOUNTER — Other Ambulatory Visit (INDEPENDENT_AMBULATORY_CARE_PROVIDER_SITE_OTHER): Payer: BC Managed Care – PPO

## 2012-09-13 ENCOUNTER — Ambulatory Visit (INDEPENDENT_AMBULATORY_CARE_PROVIDER_SITE_OTHER): Payer: BC Managed Care – PPO | Admitting: Internal Medicine

## 2012-09-13 VITALS — BP 160/88 | HR 80 | Temp 97.7°F | Resp 16 | Ht 66.0 in | Wt 157.0 lb

## 2012-09-13 DIAGNOSIS — I10 Essential (primary) hypertension: Secondary | ICD-10-CM

## 2012-09-13 DIAGNOSIS — Z23 Encounter for immunization: Secondary | ICD-10-CM

## 2012-09-13 DIAGNOSIS — T783XXA Angioneurotic edema, initial encounter: Secondary | ICD-10-CM

## 2012-09-13 DIAGNOSIS — Z0000 Encounter for general adult medical examination without abnormal findings: Secondary | ICD-10-CM | POA: Diagnosis not present

## 2012-09-13 DIAGNOSIS — Z91018 Allergy to other foods: Secondary | ICD-10-CM

## 2012-09-13 LAB — BASIC METABOLIC PANEL
BUN: 19 mg/dL (ref 6–23)
Chloride: 108 mEq/L (ref 96–112)
GFR: 82.33 mL/min (ref >60.00)
Glucose, Bld: 90 mg/dL (ref 70–99)
Potassium: 4.4 mEq/L (ref 3.5–5.1)

## 2012-09-13 LAB — LIPID PANEL
HDL: 56.8 mg/dL (ref >39.00)
LDL Cholesterol: 122 mg/dL — ABNORMAL HIGH (ref 0–99)
VLDL: 10.6 mg/dL (ref 0.0–40.0)

## 2012-09-13 LAB — TSH: TSH: 2.54 u[IU]/mL (ref 0.35–5.50)

## 2012-09-13 MED ORDER — CICLOPIROX 8 % EX SOLN: Freq: Every day | CUTANEOUS | Status: DC

## 2012-09-13 MED ORDER — PSEUDOEPHEDRINE HCL 30 MG PO TABS: 60.0000 mg | ORAL_TABLET | ORAL | Status: DC | PRN

## 2012-09-13 MED ORDER — PREDNISOLONE SODIUM PHOSPHATE 10 MG PO TBDP: 10.0000 mg | ORAL_TABLET | Freq: Every day | ORAL | Status: DC

## 2012-09-13 MED ORDER — KETOCONAZOLE 2 % EX CREA: TOPICAL_CREAM | CUTANEOUS | Status: DC | PRN

## 2012-09-13 MED ORDER — LOSARTAN POTASSIUM 100 MG PO TABS: 100.0000 mg | ORAL_TABLET | Freq: Every day | ORAL | Status: DC

## 2012-09-13 NOTE — Assessment & Plan Note (Signed)
Continue with current prescription therapy as reflected on the Med list.  

## 2012-09-13 NOTE — Progress Notes (Signed)
   Subjective:    Patient ID: Vanessa Hardy, female    DOB: Aug 10, 1946, 66 y.o.   MRN: [id number]  HPI The patient is here for a wellness exam. The patient has been doing well overall without major physical or psychological issues going on lately, except for an allergic reaction to a raisin tart The patient needs to address  chronic hypertension that has been well controlled with medicines; to address chronic  hyperlipidemia controlled with diet.    BP Readings from Last 3 Encounters:  09/13/12 160/88  02/27/12 140/90  09/29/11 120/60   Wt Readings from Last 3 Encounters:  09/13/12 157 lb (71.215 kg)  02/27/12 152 lb (68.947 kg)  09/29/11 144 lb (65.318 kg)        Review of Systems  Constitutional: Negative for chills, activity change, appetite change, fatigue and unexpected weight change.  HENT: Negative for congestion, mouth sores and sinus pressure.   Eyes: Negative for visual disturbance.  Respiratory: Negative for cough and chest tightness.   Gastrointestinal: Negative for nausea and abdominal pain.  Genitourinary: Negative for frequency, difficulty urinating and vaginal pain.  Musculoskeletal: Negative for back pain and gait problem.  Skin: Negative for pallor and rash.  Neurological: Negative for dizziness, tremors, weakness, numbness and headaches.  Psychiatric/Behavioral: Negative for confusion and disturbed wake/sleep cycle.       Objective:   Physical Exam  Constitutional: She appears well-developed and well-nourished. No distress.  HENT:  Head: Normocephalic.  Right Ear: External ear normal.  Left Ear: External ear normal.  Nose: Nose normal.  Mouth/Throat: Oropharynx is clear and moist.  Eyes: Conjunctivae normal are normal. Pupils are equal, round, and reactive to light. Right eye exhibits no discharge. Left eye exhibits no discharge.  Neck: Normal range of motion. Neck supple. No JVD present. No tracheal deviation present. No thyromegaly present.    Cardiovascular: Normal rate, regular rhythm and normal heart sounds.   Pulmonary/Chest: No stridor. No respiratory distress. She has no wheezes.  Abdominal: Soft. Bowel sounds are normal. She exhibits no distension and no mass. There is no tenderness. There is no rebound and no guarding.  Musculoskeletal: She exhibits no edema and no tenderness.  Lymphadenopathy:    She has no cervical adenopathy.  Neurological: She displays normal reflexes. No cranial nerve deficit. She exhibits normal muscle tone. Coordination normal.  Skin: No rash noted. No erythema.  Psychiatric: Her behavior is normal. Judgment and thought content normal.       anxious  Onycho B feet and scaly skin  Lab Results  Component Value Date   WBC 5.9 09/26/2011   HGB 13.1 09/26/2011   HCT 40.3 09/26/2011   PLT 183.0 09/26/2011   GLUCOSE 89 02/27/2012   CHOL 182 02/27/2012   TRIG 56.0 02/27/2012   HDL 61.50 02/27/2012   LDLDIRECT 159.4 09/13/2010   LDLCALC 109* 02/27/2012   ALT 14 02/27/2012   AST 18 02/27/2012   NA 142 02/27/2012   K 4.4 02/27/2012   CL 107 02/27/2012   CREATININE 0.9 02/27/2012   BUN 18 02/27/2012   CO2 27 02/27/2012   TSH 1.80 02/27/2012        Assessment & Plan:

## 2012-09-13 NOTE — Assessment & Plan Note (Signed)
discussed Rx prn

## 2012-09-15 NOTE — Assessment & Plan Note (Signed)
We discussed age appropriate health related issues, including available/recomended screening tests and vaccinations. We discussed a need for adhering to healthy diet and exercise. Labs/EKG were reviewed/ordered. All questions were answered.   

## 2012-09-15 NOTE — Assessment & Plan Note (Signed)
2013 raisins - butter tart - angioedema PRN use: sudafed, benadryl, prednisone. She has epipen

## 2012-12-30 ENCOUNTER — Other Ambulatory Visit: Payer: Self-pay | Admitting: Obstetrics and Gynecology

## 2012-12-30 DIAGNOSIS — Z1231 Encounter for screening mammogram for malignant neoplasm of breast: Secondary | ICD-10-CM

## 2013-01-16 ENCOUNTER — Other Ambulatory Visit: Payer: Self-pay | Admitting: *Deleted

## 2013-01-16 MED ORDER — AMLODIPINE BESYLATE 5 MG PO TABS: 5.0000 mg | ORAL_TABLET | Freq: Every day | ORAL | Status: DC

## 2013-01-22 ENCOUNTER — Ambulatory Visit
Admission: RE | Admit: 2013-01-22 | Discharge: 2013-01-22 | Disposition: A | Discharge status: Home / Self Care | Payer: BC Managed Care – PPO | Source: Ambulatory Visit | Attending: Obstetrics and Gynecology | Admitting: Obstetrics and Gynecology

## 2013-01-22 DIAGNOSIS — Z1231 Encounter for screening mammogram for malignant neoplasm of breast: Secondary | ICD-10-CM

## 2013-03-14 ENCOUNTER — Encounter: Payer: Self-pay | Admitting: Internal Medicine

## 2013-03-14 ENCOUNTER — Ambulatory Visit (INDEPENDENT_AMBULATORY_CARE_PROVIDER_SITE_OTHER): Payer: Medicare Other | Admitting: Internal Medicine

## 2013-03-14 ENCOUNTER — Other Ambulatory Visit (INDEPENDENT_AMBULATORY_CARE_PROVIDER_SITE_OTHER): Payer: BC Managed Care – PPO

## 2013-03-14 VITALS — BP 130/80 | HR 72 | Temp 97.6°F | Resp 16 | Wt 160.0 lb

## 2013-03-14 DIAGNOSIS — E785 Hyperlipidemia, unspecified: Secondary | ICD-10-CM | POA: Diagnosis not present

## 2013-03-14 DIAGNOSIS — H6122 Impacted cerumen, left ear: Secondary | ICD-10-CM

## 2013-03-14 DIAGNOSIS — F329 Major depressive disorder, single episode, unspecified: Secondary | ICD-10-CM

## 2013-03-14 DIAGNOSIS — T781XXA Other adverse food reactions, not elsewhere classified, initial encounter: Secondary | ICD-10-CM | POA: Diagnosis not present

## 2013-03-14 DIAGNOSIS — H6120 Impacted cerumen, unspecified ear: Secondary | ICD-10-CM | POA: Diagnosis not present

## 2013-03-14 DIAGNOSIS — H6121 Impacted cerumen, right ear: Secondary | ICD-10-CM

## 2013-03-14 DIAGNOSIS — I10 Essential (primary) hypertension: Secondary | ICD-10-CM | POA: Diagnosis not present

## 2013-03-14 LAB — BASIC METABOLIC PANEL
BUN: 17 mg/dL (ref 6–23)
CO2: 27 mEq/L (ref 19–32)
Chloride: 107 mEq/L (ref 96–112)
GFR: 89.01 mL/min (ref >60.00)
Glucose, Bld: 89 mg/dL (ref 70–99)
Potassium: 4.3 mEq/L (ref 3.5–5.1)
Sodium: 140 mEq/L (ref 135–145)

## 2013-03-14 MED ORDER — KETOCONAZOLE 2 % EX CREA: TOPICAL_CREAM | CUTANEOUS | Status: DC | PRN

## 2013-03-14 MED ORDER — AMLODIPINE BESYLATE 5 MG PO TABS: 5.0000 mg | ORAL_TABLET | Freq: Every day | ORAL | Status: DC

## 2013-03-14 MED ORDER — LOSARTAN POTASSIUM 100 MG PO TABS: 100.0000 mg | ORAL_TABLET | Freq: Every day | ORAL | Status: DC

## 2013-03-14 MED ORDER — CICLOPIROX 8 % EX SOLN: Freq: Every day | CUTANEOUS | Status: AC

## 2013-03-14 NOTE — Assessment & Plan Note (Signed)
Will irrigate 

## 2013-03-14 NOTE — Assessment & Plan Note (Signed)
No relapse 

## 2013-03-14 NOTE — Progress Notes (Addendum)
Subjective:    HPI  H/o an allergic reaction to a raisin tart. The patient needs to address  chronic hypertension that has been well controlled with medicines; to address chronic  hyperlipidemia controlled with diet.    BP Readings from Last 3 Encounters:  03/14/13 130/80  09/13/12 160/88  02/27/12 140/90   Wt Readings from Last 3 Encounters:  03/14/13 160 lb (72.576 kg)  09/13/12 157 lb (71.215 kg)  02/27/12 152 lb (68.947 kg)        Review of Systems  Constitutional: Negative for chills, activity change, appetite change, fatigue and unexpected weight change.  HENT: Negative for congestion, mouth sores and sinus pressure.   Eyes: Negative for visual disturbance.  Respiratory: Negative for cough and chest tightness.   Gastrointestinal: Negative for nausea and abdominal pain.  Genitourinary: Negative for frequency, difficulty urinating and vaginal pain.  Musculoskeletal: Negative for back pain and gait problem.  Skin: Negative for pallor and rash.  Neurological: Negative for dizziness, tremors, weakness, numbness and headaches.  Psychiatric/Behavioral: Negative for confusion and sleep disturbance.       Objective:   Physical Exam  Constitutional: She appears well-developed and well-nourished. No distress.  HENT:  Head: Normocephalic.  Right Ear: External ear normal.  Left Ear: External ear normal.  Nose: Nose normal.  Mouth/Throat: Oropharynx is clear and moist.  Eyes: Conjunctivae are normal. Pupils are equal, round, and reactive to light. Right eye exhibits no discharge. Left eye exhibits no discharge.  Neck: Normal range of motion. Neck supple. No JVD present. No tracheal deviation present. No thyromegaly present.  Cardiovascular: Normal rate, regular rhythm and normal heart sounds.   Pulmonary/Chest: No stridor. No respiratory distress. She has no wheezes.  Abdominal: Soft. Bowel sounds are normal. She exhibits no distension and no mass. There is no  tenderness. There is no rebound and no guarding.  Musculoskeletal: She exhibits no edema and no tenderness.  Lymphadenopathy:    She has no cervical adenopathy.  Neurological: She displays normal reflexes. No cranial nerve deficit. She exhibits normal muscle tone. Coordination normal.  Skin: No rash noted. No erythema.  Psychiatric: Her behavior is normal. Judgment and thought content normal.  anxious  Onycho B feet and scaly skin L ear wax Lab Results  Component Value Date   WBC 5.9 09/26/2011   HGB 13.1 09/26/2011   HCT 40.3 09/26/2011   PLT 183.0 09/26/2011   GLUCOSE 90 09/13/2012   CHOL 189 09/13/2012   TRIG 53.0 09/13/2012   HDL 56.80 09/13/2012   LDLDIRECT 159.4 09/13/2010   LDLCALC 122* 09/13/2012   ALT 14 02/27/2012   AST 18 02/27/2012   NA 140 09/13/2012   K 4.4 09/13/2012   CL 108 09/13/2012   CREATININE 0.8 09/13/2012   BUN 19 09/13/2012   CO2 27 09/13/2012   TSH 2.54 09/13/2012    Procedure Note :     Procedure :  Ear irrigation   Indication:  Cerumen impaction L   Risks, including pain, dizziness, eardrum perforation, bleeding, infection and others as well as benefits were explained to the patient in detail. Verbal consent was obtained and the patient agreed to proceed.    We used "The Elephant Ear Irrigation Device" filled with lukewarm water for irrigation. A large amount wax was recovered. Procedure has also required manual wax removal with an ear loop.   Tolerated well. Complications: None.   Postprocedure instructions :  Call if problems.       Assessment & Plan:

## 2013-03-14 NOTE — Assessment & Plan Note (Signed)
Labs

## 2013-03-15 ENCOUNTER — Other Ambulatory Visit: Payer: Self-pay | Admitting: Internal Medicine

## 2013-03-16 NOTE — Assessment & Plan Note (Signed)
Continue with current prescription therapy as reflected on the Med list.  

## 2013-03-19 NOTE — Progress Notes (Signed)
Quick Note:  Patient Informed and voiced understanding ______ 

## 2013-09-15 ENCOUNTER — Ambulatory Visit (INDEPENDENT_AMBULATORY_CARE_PROVIDER_SITE_OTHER): Payer: BC Managed Care – PPO | Admitting: Internal Medicine

## 2013-09-15 ENCOUNTER — Other Ambulatory Visit (INDEPENDENT_AMBULATORY_CARE_PROVIDER_SITE_OTHER): Payer: BC Managed Care – PPO

## 2013-09-15 ENCOUNTER — Encounter: Payer: Self-pay | Admitting: Internal Medicine

## 2013-09-15 VITALS — BP 138/90 | HR 68 | Temp 98.6°F | Resp 16 | Wt 159.0 lb

## 2013-09-15 DIAGNOSIS — E785 Hyperlipidemia, unspecified: Secondary | ICD-10-CM

## 2013-09-15 DIAGNOSIS — H6121 Impacted cerumen, right ear: Secondary | ICD-10-CM

## 2013-09-15 DIAGNOSIS — T7589XS Other specified effects of external causes, sequela: Secondary | ICD-10-CM

## 2013-09-15 DIAGNOSIS — J069 Acute upper respiratory infection, unspecified: Secondary | ICD-10-CM

## 2013-09-15 DIAGNOSIS — H6120 Impacted cerumen, unspecified ear: Secondary | ICD-10-CM | POA: Diagnosis not present

## 2013-09-15 DIAGNOSIS — I10 Essential (primary) hypertension: Secondary | ICD-10-CM

## 2013-09-15 DIAGNOSIS — T781XXA Other adverse food reactions, not elsewhere classified, initial encounter: Secondary | ICD-10-CM | POA: Diagnosis not present

## 2013-09-15 DIAGNOSIS — T788XXS Other adverse effects, not elsewhere classified, sequela: Secondary | ICD-10-CM | POA: Diagnosis not present

## 2013-09-15 LAB — HEPATIC FUNCTION PANEL
ALT: 11 U/L (ref 0–35)
AST: 17 U/L (ref 0–37)
Albumin: 3.9 g/dL (ref 3.5–5.2)
Alkaline Phosphatase: 38 U/L — ABNORMAL LOW (ref 39–117)
Bilirubin, Direct: 0.1 mg/dL (ref 0.0–0.3)
Total Bilirubin: 0.6 mg/dL (ref 0.3–1.2)
Total Protein: 7.3 g/dL (ref 6.0–8.3)

## 2013-09-15 LAB — BASIC METABOLIC PANEL
BUN: 13 mg/dL (ref 6–23)
CO2: 28 mEq/L (ref 19–32)
Chloride: 108 mEq/L (ref 96–112)
Potassium: 4.2 mEq/L (ref 3.5–5.1)

## 2013-09-15 LAB — LIPID PANEL
Cholesterol: 174 mg/dL (ref 0–200)
LDL Cholesterol: 101 mg/dL — ABNORMAL HIGH (ref 0–99)
Total CHOL/HDL Ratio: 3

## 2013-09-15 LAB — TSH: TSH: 2.45 u[IU]/mL (ref 0.35–5.50)

## 2013-09-15 MED ORDER — AMLODIPINE BESYLATE 5 MG PO TABS: 5.0000 mg | ORAL_TABLET | Freq: Every day | ORAL | Status: DC

## 2013-09-15 MED ORDER — LOSARTAN POTASSIUM 100 MG PO TABS: 100.0000 mg | ORAL_TABLET | Freq: Every day | ORAL | Status: DC

## 2013-09-15 NOTE — Progress Notes (Signed)
   Subjective:    HPI C/o URI vs allergies  H/o an allergic reaction to a raisin tart. The patient needs to address  chronic hypertension that has been well controlled with medicines; to address chronic  hyperlipidemia controlled with diet.    BP Readings from Last 3 Encounters:  09/15/13 138/90  03/14/13 130/80  09/13/12 160/88   Wt Readings from Last 3 Encounters:  09/15/13 159 lb (72.122 kg)  03/14/13 160 lb (72.576 kg)  09/13/12 157 lb (71.215 kg)        Review of Systems  Constitutional: Negative for chills, activity change, appetite change, fatigue and unexpected weight change.  HENT: Negative for congestion, mouth sores and sinus pressure.   Eyes: Negative for visual disturbance.  Respiratory: Negative for cough and chest tightness.   Gastrointestinal: Negative for nausea and abdominal pain.  Genitourinary: Negative for frequency, difficulty urinating and vaginal pain.  Musculoskeletal: Negative for back pain and gait problem.  Skin: Negative for pallor and rash.  Neurological: Negative for dizziness, tremors, weakness, numbness and headaches.  Psychiatric/Behavioral: Negative for confusion and sleep disturbance.       Objective:   Physical Exam  Constitutional: She appears well-developed and well-nourished. No distress.  HENT:  Head: Normocephalic.  Right Ear: External ear normal.  Left Ear: External ear normal.  Nose: Nose normal.  Mouth/Throat: Oropharynx is clear and moist.  Eyes: Conjunctivae are normal. Pupils are equal, round, and reactive to light. Right eye exhibits no discharge. Left eye exhibits no discharge.  Neck: Normal range of motion. Neck supple. No JVD present. No tracheal deviation present. No thyromegaly present.  Cardiovascular: Normal rate, regular rhythm and normal heart sounds.   Pulmonary/Chest: No stridor. No respiratory distress. She has no wheezes.  Abdominal: Soft. Bowel sounds are normal. She exhibits no distension and no  mass. There is no tenderness. There is no rebound and no guarding.  Musculoskeletal: She exhibits no edema and no tenderness.  Lymphadenopathy:    She has no cervical adenopathy.  Neurological: She displays normal reflexes. No cranial nerve deficit. She exhibits normal muscle tone. Coordination normal.  Skin: No rash noted. No erythema.  Psychiatric: Her behavior is normal. Judgment and thought content normal.  anxious  Onycho B feet and scaly skin L ear wax  Lab Results  Component Value Date   WBC 5.9 09/26/2011   HGB 13.1 09/26/2011   HCT 40.3 09/26/2011   PLT 183.0 09/26/2011   GLUCOSE 89 03/14/2013   CHOL 189 09/13/2012   TRIG 53.0 09/13/2012   HDL 56.80 09/13/2012   LDLDIRECT 159.4 09/13/2010   LDLCALC 122* 09/13/2012   ALT 14 02/27/2012   AST 18 02/27/2012   NA 140 03/14/2013   K 4.3 03/14/2013   CL 107 03/14/2013   CREATININE 0.7 03/14/2013   BUN 17 03/14/2013   CO2 27 03/14/2013   TSH 1.96 03/14/2013            Assessment & Plan:

## 2013-09-15 NOTE — Assessment & Plan Note (Signed)
No relapse 

## 2013-09-15 NOTE — Assessment & Plan Note (Signed)
Continue with current prescription therapy as reflected on the Med list.  

## 2013-09-15 NOTE — Assessment & Plan Note (Signed)
On Rx 

## 2013-09-15 NOTE — Assessment & Plan Note (Signed)
Use OTC meds prn

## 2013-12-22 ENCOUNTER — Other Ambulatory Visit: Payer: Self-pay

## 2013-12-22 DIAGNOSIS — Z1231 Encounter for screening mammogram for malignant neoplasm of breast: Secondary | ICD-10-CM

## 2014-01-19 ENCOUNTER — Telehealth: Payer: Self-pay

## 2014-01-19 MED ORDER — OSELTAMIVIR PHOSPHATE 75 MG PO CAPS: 75.0000 mg | ORAL_CAPSULE | Freq: Every day | ORAL | Status: DC

## 2014-01-19 NOTE — Telephone Encounter (Signed)
The patient called hoping to get an rx for Tamiflu due to her husband being diagnosed with the flu recently  Callback- [phone]

## 2014-01-19 NOTE — Telephone Encounter (Signed)
Ok - emailed Thx 

## 2014-01-19 NOTE — Telephone Encounter (Signed)
Left detailed message informing pt of below.  

## 2014-01-26 ENCOUNTER — Ambulatory Visit
Admission: RE | Admit: 2014-01-26 | Discharge: 2014-01-26 | Disposition: A | Discharge status: Home / Self Care | Payer: Medicare Other | Source: Ambulatory Visit

## 2014-01-26 DIAGNOSIS — Z1231 Encounter for screening mammogram for malignant neoplasm of breast: Secondary | ICD-10-CM

## 2014-02-23 DIAGNOSIS — M25559 Pain in unspecified hip: Secondary | ICD-10-CM | POA: Diagnosis not present

## 2014-02-23 DIAGNOSIS — M76899 Other specified enthesopathies of unspecified lower limb, excluding foot: Secondary | ICD-10-CM | POA: Diagnosis not present

## 2014-03-16 ENCOUNTER — Ambulatory Visit (INDEPENDENT_AMBULATORY_CARE_PROVIDER_SITE_OTHER): Payer: Medicare Other | Admitting: Internal Medicine

## 2014-03-16 ENCOUNTER — Encounter: Payer: Self-pay | Admitting: Internal Medicine

## 2014-03-16 VITALS — BP 142/88 | HR 76 | Temp 98.0°F | Resp 16 | Ht 66.0 in | Wt 161.0 lb

## 2014-03-16 DIAGNOSIS — E669 Obesity, unspecified: Secondary | ICD-10-CM

## 2014-03-16 DIAGNOSIS — E785 Hyperlipidemia, unspecified: Secondary | ICD-10-CM

## 2014-03-16 DIAGNOSIS — F3289 Other specified depressive episodes: Secondary | ICD-10-CM

## 2014-03-16 DIAGNOSIS — I10 Essential (primary) hypertension: Secondary | ICD-10-CM

## 2014-03-16 DIAGNOSIS — Z23 Encounter for immunization: Secondary | ICD-10-CM

## 2014-03-16 DIAGNOSIS — F329 Major depressive disorder, single episode, unspecified: Secondary | ICD-10-CM

## 2014-03-16 MED ORDER — LOSARTAN POTASSIUM 100 MG PO TABS: 100.0000 mg | ORAL_TABLET | Freq: Every day | ORAL | Status: DC

## 2014-03-16 MED ORDER — AMLODIPINE BESYLATE 5 MG PO TABS: 5.0000 mg | ORAL_TABLET | Freq: Every day | ORAL | Status: DC

## 2014-03-16 NOTE — Progress Notes (Signed)
Pre visit review using our clinic review tool, if applicable. No additional management support is needed unless otherwise documented below in the visit note. 

## 2014-03-16 NOTE — Progress Notes (Signed)
   Subjective:    HPI   H/o an allergic reaction to a raisin tart.  The patient needs to address  chronic hypertension that has been well controlled with medicines; to address chronic  hyperlipidemia controlled with diet.    BP Readings from Last 3 Encounters:  03/16/14 142/88  09/15/13 138/90  03/14/13 130/80   Wt Readings from Last 3 Encounters:  03/16/14 161 lb (73.029 kg)  09/15/13 159 lb (72.122 kg)  03/14/13 160 lb (72.576 kg)        Review of Systems  Constitutional: Negative for chills, activity change, appetite change, fatigue and unexpected weight change.  HENT: Negative for congestion, mouth sores and sinus pressure.   Eyes: Negative for visual disturbance.  Respiratory: Negative for cough and chest tightness.   Gastrointestinal: Negative for nausea and abdominal pain.  Genitourinary: Negative for frequency, difficulty urinating and vaginal pain.  Musculoskeletal: Negative for back pain and gait problem.  Skin: Negative for pallor and rash.  Neurological: Negative for dizziness, tremors, weakness, numbness and headaches.  Psychiatric/Behavioral: Negative for confusion and sleep disturbance.       Objective:   Physical Exam  Constitutional: She appears well-developed and well-nourished. No distress.  HENT:  Head: Normocephalic.  Right Ear: External ear normal.  Left Ear: External ear normal.  Nose: Nose normal.  Mouth/Throat: Oropharynx is clear and moist.  Eyes: Conjunctivae are normal. Pupils are equal, round, and reactive to light. Right eye exhibits no discharge. Left eye exhibits no discharge.  Neck: Normal range of motion. Neck supple. No JVD present. No tracheal deviation present. No thyromegaly present.  Cardiovascular: Normal rate, regular rhythm and normal heart sounds.   Pulmonary/Chest: No stridor. No respiratory distress. She has no wheezes.  Abdominal: Soft. Bowel sounds are normal. She exhibits no distension and no mass. There is no  tenderness. There is no rebound and no guarding.  Musculoskeletal: She exhibits no edema and no tenderness.  Lymphadenopathy:    She has no cervical adenopathy.  Neurological: She displays normal reflexes. No cranial nerve deficit. She exhibits normal muscle tone. Coordination normal.  Skin: No rash noted. No erythema.  Psychiatric: Her behavior is normal. Judgment and thought content normal.  anxious  Onycho B feet and scaly skin L ear wax  Lab Results  Component Value Date   WBC 5.9 09/26/2011   HGB 13.1 09/26/2011   HCT 40.3 09/26/2011   PLT 183.0 09/26/2011   GLUCOSE 97 09/15/2013   CHOL 174 09/15/2013   TRIG 64.0 09/15/2013   HDL 60.30 09/15/2013   LDLDIRECT 159.4 09/13/2010   LDLCALC 101* 09/15/2013   ALT 11 09/15/2013   AST 17 09/15/2013   NA 141 09/15/2013   K 4.2 09/15/2013   CL 108 09/15/2013   CREATININE 0.8 09/15/2013   BUN 13 09/15/2013   CO2 28 09/15/2013   TSH 2.45 09/15/2013            Assessment & Plan:

## 2014-03-17 NOTE — Assessment & Plan Note (Signed)
Wt Readings from Last 3 Encounters:  03/16/14 161 lb (73.029 kg)  09/15/13 159 lb (72.122 kg)  03/14/13 160 lb (72.576 kg)  Doing well

## 2014-03-17 NOTE — Assessment & Plan Note (Signed)
Doing well. Managing stress

## 2014-03-17 NOTE — Assessment & Plan Note (Signed)
On diet Wt loss

## 2014-03-17 NOTE — Assessment & Plan Note (Signed)
Continue with current prescription therapy as reflected on the Med list.  

## 2014-04-07 DIAGNOSIS — N905 Atrophy of vulva: Secondary | ICD-10-CM | POA: Diagnosis not present

## 2014-04-16 ENCOUNTER — Ambulatory Visit (INDEPENDENT_AMBULATORY_CARE_PROVIDER_SITE_OTHER): Payer: Medicare Other | Admitting: *Deleted

## 2014-04-16 DIAGNOSIS — Z23 Encounter for immunization: Secondary | ICD-10-CM | POA: Diagnosis not present

## 2014-06-01 ENCOUNTER — Other Ambulatory Visit: Payer: Self-pay

## 2014-06-01 MED ORDER — KETOCONAZOLE 2 % EX CREA
TOPICAL_CREAM | CUTANEOUS | Status: DC | PRN
Start: 2014-06-01 — End: 2014-06-02

## 2014-06-01 NOTE — Telephone Encounter (Signed)
Pt called for a refill of her nizoral cream. rx sent electronically to pharmacy

## 2014-06-02 ENCOUNTER — Other Ambulatory Visit: Payer: Self-pay | Admitting: *Deleted

## 2014-06-02 MED ORDER — KETOCONAZOLE 2 % EX CREA
TOPICAL_CREAM | CUTANEOUS | Status: DC | PRN
Start: 2014-06-02 — End: 2015-05-21

## 2014-06-03 DIAGNOSIS — H1045 Other chronic allergic conjunctivitis: Secondary | ICD-10-CM | POA: Diagnosis not present

## 2014-06-03 DIAGNOSIS — Z961 Presence of intraocular lens: Secondary | ICD-10-CM | POA: Diagnosis not present

## 2014-06-03 DIAGNOSIS — H04129 Dry eye syndrome of unspecified lacrimal gland: Secondary | ICD-10-CM | POA: Diagnosis not present

## 2014-09-21 ENCOUNTER — Ambulatory Visit (INDEPENDENT_AMBULATORY_CARE_PROVIDER_SITE_OTHER): Payer: Medicare Other | Admitting: Internal Medicine

## 2014-09-21 ENCOUNTER — Encounter: Payer: Self-pay | Admitting: Internal Medicine

## 2014-09-21 ENCOUNTER — Other Ambulatory Visit (INDEPENDENT_AMBULATORY_CARE_PROVIDER_SITE_OTHER): Payer: Medicare Other

## 2014-09-21 VITALS — BP 140/98 | HR 72 | Temp 98.0°F | Resp 16 | Ht 66.0 in | Wt 169.0 lb

## 2014-09-21 DIAGNOSIS — T783XXA Angioneurotic edema, initial encounter: Secondary | ICD-10-CM | POA: Insufficient documentation

## 2014-09-21 DIAGNOSIS — Z23 Encounter for immunization: Secondary | ICD-10-CM

## 2014-09-21 DIAGNOSIS — T783XXD Angioneurotic edema, subsequent encounter: Secondary | ICD-10-CM

## 2014-09-21 DIAGNOSIS — E785 Hyperlipidemia, unspecified: Secondary | ICD-10-CM

## 2014-09-21 LAB — HEPATIC FUNCTION PANEL
ALT: 10 U/L (ref 0–35)
AST: 18 U/L (ref 0–37)
Albumin: 4.3 g/dL (ref 3.5–5.2)
Alkaline Phosphatase: 42 U/L (ref 39–117)
BILIRUBIN DIRECT: 0.1 mg/dL (ref 0.0–0.3)
Total Bilirubin: 0.8 mg/dL (ref 0.2–1.2)
Total Protein: 8.1 g/dL (ref 6.0–8.3)

## 2014-09-21 LAB — URINALYSIS
Hgb urine dipstick: NEGATIVE
Leukocytes, UA: NEGATIVE
Nitrite: NEGATIVE
PH: 5.5 (ref 5.0–8.0)
URINE GLUCOSE: NEGATIVE
Urobilinogen, UA: 0.2 (ref 0.0–1.0)

## 2014-09-21 LAB — CBC WITH DIFFERENTIAL/PLATELET
BASOS PCT: 0.5 % (ref 0.0–3.0)
Basophils Absolute: 0 10*3/uL (ref 0.0–0.1)
EOS ABS: 0.2 10*3/uL (ref 0.0–0.7)
EOS PCT: 2.4 % (ref 0.0–5.0)
HCT: 42.4 % (ref 36.0–46.0)
Hemoglobin: 14 g/dL (ref 12.0–15.0)
LYMPHS PCT: 21.5 % (ref 12.0–46.0)
Lymphs Abs: 1.6 10*3/uL (ref 0.7–4.0)
MCHC: 33.1 g/dL (ref 30.0–36.0)
MCV: 94.6 fl (ref 78.0–100.0)
MONOS PCT: 5.1 % (ref 3.0–12.0)
Monocytes Absolute: 0.4 10*3/uL (ref 0.1–1.0)
NEUTROS PCT: 70.5 % (ref 43.0–77.0)
Neutro Abs: 5.2 10*3/uL (ref 1.4–7.7)
Platelets: 233 10*3/uL (ref 150.0–400.0)
RBC: 4.48 Mil/uL (ref 3.87–5.11)
RDW: 13.5 % (ref 11.5–15.5)
WBC: 7.4 10*3/uL (ref 4.0–10.5)

## 2014-09-21 LAB — LIPID PANEL
Cholesterol: 202 mg/dL — ABNORMAL HIGH (ref 0–200)
HDL: 51.3 mg/dL (ref >39.00)
LDL Cholesterol: 135 mg/dL — ABNORMAL HIGH (ref 0–99)
NonHDL: 150.7
TRIGLYCERIDES: 78 mg/dL (ref 0.0–149.0)
Total CHOL/HDL Ratio: 4
VLDL: 15.6 mg/dL (ref 0.0–40.0)

## 2014-09-21 LAB — BASIC METABOLIC PANEL
BUN: 22 mg/dL (ref 6–23)
CALCIUM: 9.5 mg/dL (ref 8.4–10.5)
CO2: 24 mEq/L (ref 19–32)
Chloride: 107 mEq/L (ref 96–112)
Creatinine, Ser: 0.9 mg/dL (ref 0.4–1.2)
GFR: 68.94 mL/min (ref >60.00)
Glucose, Bld: 89 mg/dL (ref 70–99)
Potassium: 4.2 mEq/L (ref 3.5–5.1)
SODIUM: 141 meq/L (ref 135–145)

## 2014-09-21 LAB — TSH: TSH: 2.85 u[IU]/mL (ref 0.35–4.50)

## 2014-09-21 MED ORDER — AMLODIPINE BESYLATE 5 MG PO TABS: 5.0000 mg | ORAL_TABLET | Freq: Every day | ORAL | Status: DC

## 2014-09-21 MED ORDER — SPIRONOLACTONE 25 MG PO TABS: 25.0000 mg | ORAL_TABLET | Freq: Every day | ORAL | Status: DC

## 2014-09-21 NOTE — Assessment & Plan Note (Signed)
Labs Start  Spironolactone

## 2014-09-21 NOTE — Assessment & Plan Note (Signed)
Labs

## 2014-09-21 NOTE — Progress Notes (Signed)
   Subjective:    HPI   C/o angioedema 2 episodes - one in July 2015 and one 1 month ago - went to the hospital; h/o an allergic reaction to a raisin tart - now thinking of a possible reaction to Losartan. Losartan was stopped 1 mo ago.  The patient needs to address  chronic hypertension that has been well controlled with medicines; to address chronic  hyperlipidemia controlled with diet.  BP Readings from Last 3 Encounters:  09/21/14 140/98  03/16/14 142/88  09/15/13 138/90   Wt Readings from Last 3 Encounters:  09/21/14 169 lb (76.658 kg)  03/16/14 161 lb (73.029 kg)  09/15/13 159 lb (72.122 kg)    Review of Systems  Constitutional: Negative for chills, activity change, appetite change, fatigue and unexpected weight change.  HENT: Negative for congestion, mouth sores and sinus pressure.   Eyes: Negative for visual disturbance.  Respiratory: Negative for cough and chest tightness.   Gastrointestinal: Negative for nausea and abdominal pain.  Genitourinary: Negative for frequency, difficulty urinating and vaginal pain.  Musculoskeletal: Negative for back pain and gait problem.  Skin: Negative for pallor and rash.  Neurological: Negative for dizziness, tremors, weakness, numbness and headaches.  Psychiatric/Behavioral: Negative for confusion and sleep disturbance.       Objective:   Physical Exam  Constitutional: She appears well-developed and well-nourished. No distress.  HENT:  Head: Normocephalic.  Right Ear: External ear normal.  Left Ear: External ear normal.  Nose: Nose normal.  Mouth/Throat: Oropharynx is clear and moist.  Eyes: Conjunctivae are normal. Pupils are equal, round, and reactive to light. Right eye exhibits no discharge. Left eye exhibits no discharge.  Neck: Normal range of motion. Neck supple. No JVD present. No tracheal deviation present. No thyromegaly present.  Cardiovascular: Normal rate, regular rhythm and normal heart sounds.   Pulmonary/Chest:  No stridor. No respiratory distress. She has no wheezes.  Abdominal: Soft. Bowel sounds are normal. She exhibits no distension and no mass. There is no tenderness. There is no rebound and no guarding.  Musculoskeletal: She exhibits no edema and no tenderness.  Lymphadenopathy:    She has no cervical adenopathy.  Neurological: She displays normal reflexes. No cranial nerve deficit. She exhibits normal muscle tone. Coordination normal.  Skin: No rash noted. No erythema.  Psychiatric: Her behavior is normal. Judgment and thought content normal.  anxious  Onycho B feet and scaly skin   Lab Results  Component Value Date   WBC 5.9 09/26/2011   HGB 13.1 09/26/2011   HCT 40.3 09/26/2011   PLT 183.0 09/26/2011   GLUCOSE 97 09/15/2013   CHOL 174 09/15/2013   TRIG 64.0 09/15/2013   HDL 60.30 09/15/2013   LDLDIRECT 159.4 09/13/2010   LDLCALC 101* 09/15/2013   ALT 11 09/15/2013   AST 17 09/15/2013   NA 141 09/15/2013   K 4.2 09/15/2013   CL 108 09/15/2013   CREATININE 0.8 09/15/2013   BUN 13 09/15/2013   CO2 28 09/15/2013   TSH 2.45 09/15/2013            Assessment & Plan:

## 2014-09-21 NOTE — Addendum Note (Signed)
Addended by: Cresenciano Lick on: 09/21/2014 11:39 AM   Modules accepted: Orders

## 2014-09-21 NOTE — Assessment & Plan Note (Addendum)
2015 recurrent ?etiology: x3 episodes since 2013 Labs - allergy panels Allergist ref was suggested Off Losartan D/c Listerine mouthwash Epi-pen, meds prn

## 2014-09-22 ENCOUNTER — Telehealth: Payer: Self-pay | Admitting: *Deleted

## 2014-09-22 NOTE — Telephone Encounter (Signed)
Message copied by Earnstine Regal on Tue Sep 22, 2014 11:45 AM ------      Message from: Cassandria Anger      Created: Tue Sep 22, 2014  7:40 AM       Erline Levine, please, inform patient that all labs are normal except for a little elev cholesterol.  Please, mail the labs to the patient.                         Thx       ------

## 2014-09-22 NOTE — Telephone Encounter (Signed)
Message copied by Earnstine Regal on Tue Sep 22, 2014 11:44 AM ------      Message from: Cassandria Anger      Created: Tue Sep 22, 2014  7:40 AM       Erline Levine, please, inform patient that all labs are normal except for a little elev cholesterol.  Please, mail the labs to the patient.                         Thx       ------

## 2014-09-22 NOTE — Telephone Encounter (Signed)
Notified pt spoke with husband Legrand Como) gave md response. Mailed copy of labs to pt address...Johny Chess

## 2014-10-06 DIAGNOSIS — Z23 Encounter for immunization: Secondary | ICD-10-CM | POA: Diagnosis not present

## 2014-10-26 DIAGNOSIS — L814 Other melanin hyperpigmentation: Secondary | ICD-10-CM | POA: Diagnosis not present

## 2014-10-26 DIAGNOSIS — L821 Other seborrheic keratosis: Secondary | ICD-10-CM | POA: Diagnosis not present

## 2014-10-27 DIAGNOSIS — Z961 Presence of intraocular lens: Secondary | ICD-10-CM | POA: Diagnosis not present

## 2014-10-27 DIAGNOSIS — H40013 Open angle with borderline findings, low risk, bilateral: Secondary | ICD-10-CM | POA: Diagnosis not present

## 2014-11-17 ENCOUNTER — Other Ambulatory Visit (INDEPENDENT_AMBULATORY_CARE_PROVIDER_SITE_OTHER): Payer: Medicare Other

## 2014-11-17 ENCOUNTER — Ambulatory Visit (INDEPENDENT_AMBULATORY_CARE_PROVIDER_SITE_OTHER): Payer: Medicare Other | Admitting: Internal Medicine

## 2014-11-17 ENCOUNTER — Encounter: Payer: Self-pay | Admitting: Internal Medicine

## 2014-11-17 VITALS — BP 130/70 | HR 70 | Temp 97.9°F | Wt 169.0 lb

## 2014-11-17 DIAGNOSIS — I10 Essential (primary) hypertension: Secondary | ICD-10-CM

## 2014-11-17 DIAGNOSIS — E785 Hyperlipidemia, unspecified: Secondary | ICD-10-CM | POA: Diagnosis not present

## 2014-11-17 DIAGNOSIS — M2550 Pain in unspecified joint: Secondary | ICD-10-CM

## 2014-11-17 LAB — CBC WITH DIFFERENTIAL/PLATELET
Basophils Absolute: 0 10*3/uL (ref 0.0–0.1)
Basophils Relative: 0.4 % (ref 0.0–3.0)
Eosinophils Absolute: 0.1 10*3/uL (ref 0.0–0.7)
Eosinophils Relative: 1.8 % (ref 0.0–5.0)
HCT: 42.2 % (ref 36.0–46.0)
Hemoglobin: 13.9 g/dL (ref 12.0–15.0)
Lymphocytes Relative: 25.8 % (ref 12.0–46.0)
Lymphs Abs: 1.9 10*3/uL (ref 0.7–4.0)
MCHC: 32.9 g/dL (ref 30.0–36.0)
MCV: 94.1 fl (ref 78.0–100.0)
MONO ABS: 0.5 10*3/uL (ref 0.1–1.0)
MONOS PCT: 6.8 % (ref 3.0–12.0)
NEUTROS PCT: 65.2 % (ref 43.0–77.0)
Neutro Abs: 4.8 10*3/uL (ref 1.4–7.7)
PLATELETS: 257 10*3/uL (ref 150.0–400.0)
RBC: 4.49 Mil/uL (ref 3.87–5.11)
RDW: 13.5 % (ref 11.5–15.5)
WBC: 7.4 10*3/uL (ref 4.0–10.5)

## 2014-11-17 LAB — URIC ACID: Uric Acid, Serum: 5.5 mg/dL (ref 2.4–7.0)

## 2014-11-17 LAB — BASIC METABOLIC PANEL
BUN: 19 mg/dL (ref 6–23)
CO2: 25 mEq/L (ref 19–32)
CREATININE: 0.9 mg/dL (ref 0.4–1.2)
Calcium: 9.6 mg/dL (ref 8.4–10.5)
Chloride: 108 mEq/L (ref 96–112)
GFR: 68.01 mL/min (ref >60.00)
Glucose, Bld: 95 mg/dL (ref 70–99)
Potassium: 4.5 mEq/L (ref 3.5–5.1)
Sodium: 140 mEq/L (ref 135–145)

## 2014-11-17 LAB — SEDIMENTATION RATE: SED RATE: 18 mm/h (ref 0–22)

## 2014-11-17 MED ORDER — SPIRONOLACTONE 25 MG PO TABS: 50.0000 mg | ORAL_TABLET | Freq: Every day | ORAL | Status: DC

## 2014-11-17 NOTE — Assessment & Plan Note (Signed)
Labs

## 2014-11-17 NOTE — Progress Notes (Signed)
Pre visit review using our clinic review tool, if applicable. No additional management support is needed unless otherwise documented below in the visit note. 

## 2014-11-17 NOTE — Progress Notes (Signed)
   Subjective:    HPI  Traveling to [place] soon 12/13 - Vanessa Hardy went to travel clinic for shots and dexamethasone Rx - had shots for yellow fever, typhoid and Malarone for jungles. C/o swelling  F/u angioedema 2 episodes - one in July 2015 and one 1 month ago - went to the hospital; h/o an allergic reaction to a raisin tart - now thinking of a possible reaction to Losartan. Losartan was stopped 1 mo ago.  The patient needs to address  chronic hypertension that has been well controlled with medicines; to address chronic  hyperlipidemia controlled with diet.  BP Readings from Last 3 Encounters:  11/17/14 130/70  09/21/14 140/98  03/16/14 142/88   Wt Readings from Last 3 Encounters:  11/17/14 169 lb (76.658 kg)  09/21/14 169 lb (76.658 kg)  03/16/14 161 lb (73.029 kg)    Review of Systems  Constitutional: Negative for chills, activity change, appetite change, fatigue and unexpected weight change.  HENT: Negative for congestion, mouth sores and sinus pressure.   Eyes: Negative for visual disturbance.  Respiratory: Negative for cough and chest tightness.   Gastrointestinal: Negative for nausea and abdominal pain.  Genitourinary: Negative for frequency, difficulty urinating and vaginal pain.  Musculoskeletal: Negative for back pain and gait problem.  Skin: Negative for pallor and rash.  Neurological: Negative for dizziness, tremors, weakness, numbness and headaches.  Psychiatric/Behavioral: Negative for confusion and sleep disturbance.       Objective:   Physical Exam  Constitutional: She appears well-developed. No distress.  HENT:  Head: Normocephalic.  Right Ear: External ear normal.  Left Ear: External ear normal.  Nose: Nose normal.  Mouth/Throat: Oropharynx is clear and moist.  Eyes: Conjunctivae are normal. Pupils are equal, round, and reactive to light. Right eye exhibits no discharge. Left eye exhibits no discharge.  Neck: Normal range of motion. Neck supple. No JVD  present. No tracheal deviation present. No thyromegaly present.  Cardiovascular: Normal rate, regular rhythm and normal heart sounds.   Pulmonary/Chest: No stridor. No respiratory distress. She has no wheezes.  Abdominal: Soft. Bowel sounds are normal. She exhibits no distension and no mass. There is no tenderness. There is no rebound and no guarding.  Musculoskeletal: She exhibits no edema or tenderness.  Lymphadenopathy:    She has no cervical adenopathy.  Neurological: She displays normal reflexes. No cranial nerve deficit. She exhibits normal muscle tone. Coordination normal.  Skin: No rash noted. No erythema.  Psychiatric: She has a normal mood and affect. Her behavior is normal. Judgment and thought content normal.  Onycho B feet and scaly skin   Lab Results  Component Value Date   WBC 7.4 09/21/2014   HGB 14.0 09/21/2014   HCT 42.4 09/21/2014   PLT 233.0 09/21/2014   GLUCOSE 89 09/21/2014   CHOL 202* 09/21/2014   TRIG 78.0 09/21/2014   HDL 51.30 09/21/2014   LDLDIRECT 159.4 09/13/2010   LDLCALC 135* 09/21/2014   ALT 10 09/21/2014   AST 18 09/21/2014   NA 141 09/21/2014   K 4.2 09/21/2014   CL 107 09/21/2014   CREATININE 0.9 09/21/2014   BUN 22 09/21/2014   CO2 24 09/21/2014   TSH 2.85 09/21/2014            Assessment & Plan:

## 2014-12-09 NOTE — Assessment & Plan Note (Signed)
Continue with current diet  

## 2014-12-09 NOTE — Assessment & Plan Note (Signed)
BP Readings from Last 3 Encounters:  11/17/14 130/70  09/21/14 140/98  03/16/14 142/88    Continue with current prescription therapy as reflected on the Med list.

## 2014-12-22 ENCOUNTER — Ambulatory Visit: Payer: Medicare Other | Admitting: Internal Medicine

## 2014-12-28 ENCOUNTER — Other Ambulatory Visit: Payer: Self-pay

## 2014-12-28 DIAGNOSIS — Z1231 Encounter for screening mammogram for malignant neoplasm of breast: Secondary | ICD-10-CM

## 2015-01-19 ENCOUNTER — Ambulatory Visit: Payer: Medicare Other | Admitting: Internal Medicine

## 2015-01-27 ENCOUNTER — Ambulatory Visit: Payer: Medicare Other

## 2015-01-28 ENCOUNTER — Encounter (INDEPENDENT_AMBULATORY_CARE_PROVIDER_SITE_OTHER): Payer: Self-pay

## 2015-01-28 ENCOUNTER — Ambulatory Visit
Admission: RE | Admit: 2015-01-28 | Discharge: 2015-01-28 | Disposition: A | Discharge status: Home / Self Care | Payer: Medicare Other | Source: Ambulatory Visit

## 2015-01-28 DIAGNOSIS — Z1231 Encounter for screening mammogram for malignant neoplasm of breast: Secondary | ICD-10-CM | POA: Diagnosis not present

## 2015-01-29 ENCOUNTER — Ambulatory Visit (INDEPENDENT_AMBULATORY_CARE_PROVIDER_SITE_OTHER): Payer: Medicare Other | Admitting: Internal Medicine

## 2015-01-29 ENCOUNTER — Encounter: Payer: Self-pay | Admitting: Internal Medicine

## 2015-01-29 VITALS — BP 120/76 | HR 77 | Wt 171.0 lb

## 2015-01-29 DIAGNOSIS — Z91018 Allergy to other foods: Secondary | ICD-10-CM | POA: Diagnosis not present

## 2015-01-29 DIAGNOSIS — I10 Essential (primary) hypertension: Secondary | ICD-10-CM

## 2015-01-29 DIAGNOSIS — R55 Syncope and collapse: Secondary | ICD-10-CM | POA: Diagnosis not present

## 2015-01-29 DIAGNOSIS — R635 Abnormal weight gain: Secondary | ICD-10-CM

## 2015-01-29 DIAGNOSIS — T783XXS Angioneurotic edema, sequela: Secondary | ICD-10-CM | POA: Diagnosis not present

## 2015-01-29 NOTE — Progress Notes (Signed)
   Subjective:    HPI   On Jan 16 Vanessa Hardy was at the party and felt clammy and faint at the party and it lasted for 24 hrs - resolved  Vanessa Hardy went to [place] soon 12/13 - Vanessa Hardy went to travel clinic for shots and dexamethasone Rx - had shots for yellow fever, typhoid and Malarone for jungles.   F/u angioedema 2 episodes - one in July 2015 and one 1 month ago - went to the hospital; h/o an allergic reaction to a raisin tart - now thinking of a possible reaction to Losartan. Losartan was stopped 1 mo ago.  The patient needs to address  chronic hypertension that has been well controlled with medicines; to address chronic  hyperlipidemia controlled with diet.  BP Readings from Last 3 Encounters:  01/29/15 120/76  11/17/14 130/70  09/21/14 140/98   Wt Readings from Last 3 Encounters:  01/29/15 171 lb (77.565 kg)  11/17/14 169 lb (76.658 kg)  09/21/14 169 lb (76.658 kg)    Review of Systems  Constitutional: Negative for chills, activity change, appetite change, fatigue and unexpected weight change.  HENT: Negative for congestion, mouth sores and sinus pressure.   Eyes: Negative for visual disturbance.  Respiratory: Negative for cough and chest tightness.   Gastrointestinal: Negative for nausea and abdominal pain.  Genitourinary: Negative for frequency, difficulty urinating and vaginal pain.  Musculoskeletal: Negative for back pain and gait problem.  Skin: Negative for pallor and rash.  Neurological: Negative for dizziness, tremors, weakness, numbness and headaches.  Psychiatric/Behavioral: Negative for confusion and sleep disturbance.       Objective:   Physical Exam  Constitutional: She appears well-developed. No distress.  HENT:  Head: Normocephalic.  Right Ear: External ear normal.  Left Ear: External ear normal.  Nose: Nose normal.  Mouth/Throat: Oropharynx is clear and moist.  Eyes: Conjunctivae are normal. Pupils are equal, round, and reactive to light. Right eye exhibits no  discharge. Left eye exhibits no discharge.  Neck: Normal range of motion. Neck supple. No JVD present. No tracheal deviation present. No thyromegaly present.  Cardiovascular: Normal rate, regular rhythm and normal heart sounds.   Pulmonary/Chest: No stridor. No respiratory distress. She has no wheezes.  Abdominal: Soft. Bowel sounds are normal. She exhibits no distension and no mass. There is no tenderness. There is no rebound and no guarding.  Musculoskeletal: She exhibits no edema or tenderness.  Lymphadenopathy:    She has no cervical adenopathy.  Neurological: She displays normal reflexes. No cranial nerve deficit. She exhibits normal muscle tone. Coordination normal.  Skin: No rash noted. No erythema.  Psychiatric: She has a normal mood and affect. Her behavior is normal. Judgment and thought content normal.  Onycho B feet and scaly skin   Lab Results  Component Value Date   WBC 7.4 11/17/2014   HGB 13.9 11/17/2014   HCT 42.2 11/17/2014   PLT 257.0 11/17/2014   GLUCOSE 95 11/17/2014   CHOL 202* 09/21/2014   TRIG 78.0 09/21/2014   HDL 51.30 09/21/2014   LDLDIRECT 159.4 09/13/2010   LDLCALC 135* 09/21/2014   ALT 10 09/21/2014   AST 18 09/21/2014   NA 140 11/17/2014   K 4.5 11/17/2014   CL 108 11/17/2014   CREATININE 0.9 11/17/2014   BUN 19 11/17/2014   CO2 25 11/17/2014   TSH 2.85 09/21/2014      EKG      Assessment & Plan:

## 2015-01-29 NOTE — Assessment & Plan Note (Signed)
Continue with current prescription therapy as reflected on the Med list.  

## 2015-01-29 NOTE — Assessment & Plan Note (Signed)
Wt Readings from Last 3 Encounters:  01/29/15 171 lb (77.565 kg)  11/17/14 169 lb (76.658 kg)  09/21/14 169 lb (76.658 kg)

## 2015-01-29 NOTE — Assessment & Plan Note (Addendum)
No relapse  PRN use: sudafed, benadryl, prednisone. She has epipen

## 2015-01-29 NOTE — Progress Notes (Signed)
Pre visit review using our clinic review tool, if applicable. No additional management support is needed unless otherwise documented below in the visit note. 

## 2015-01-30 NOTE — Assessment & Plan Note (Signed)
01/02/15 - poss related to a virus or a food reaction - unknown No relapse EKG

## 2015-02-01 ENCOUNTER — Telehealth: Payer: Self-pay | Admitting: Internal Medicine

## 2015-02-01 NOTE — Telephone Encounter (Signed)
emmi emailed °

## 2015-04-12 DIAGNOSIS — Z124 Encounter for screening for malignant neoplasm of cervix: Secondary | ICD-10-CM | POA: Diagnosis not present

## 2015-05-19 ENCOUNTER — Ambulatory Visit: Payer: Medicare Other | Admitting: Internal Medicine

## 2015-05-21 ENCOUNTER — Ambulatory Visit (INDEPENDENT_AMBULATORY_CARE_PROVIDER_SITE_OTHER): Payer: Medicare Other | Admitting: Internal Medicine

## 2015-05-21 ENCOUNTER — Other Ambulatory Visit (INDEPENDENT_AMBULATORY_CARE_PROVIDER_SITE_OTHER): Payer: Medicare Other

## 2015-05-21 ENCOUNTER — Encounter: Payer: Self-pay | Admitting: Internal Medicine

## 2015-05-21 VITALS — BP 128/82 | HR 72 | Temp 97.9°F | Ht 66.0 in | Wt 168.2 lb

## 2015-05-21 DIAGNOSIS — Z23 Encounter for immunization: Secondary | ICD-10-CM

## 2015-05-21 DIAGNOSIS — I10 Essential (primary) hypertension: Secondary | ICD-10-CM

## 2015-05-21 LAB — BASIC METABOLIC PANEL
BUN: 28 mg/dL — ABNORMAL HIGH (ref 6–23)
CO2: 27 mEq/L (ref 19–32)
Calcium: 9.6 mg/dL (ref 8.4–10.5)
Chloride: 103 mEq/L (ref 96–112)
Creatinine, Ser: 0.93 mg/dL (ref 0.40–1.20)
GFR: 63.71 mL/min (ref >60.00)
Glucose, Bld: 88 mg/dL (ref 70–99)
Potassium: 4.1 mEq/L (ref 3.5–5.1)
SODIUM: 137 meq/L (ref 135–145)

## 2015-05-21 LAB — TSH: TSH: 2.62 u[IU]/mL (ref 0.35–4.50)

## 2015-05-21 MED ORDER — SPIRONOLACTONE 25 MG PO TABS: 50.0000 mg | ORAL_TABLET | Freq: Every day | ORAL | Status: DC

## 2015-05-21 MED ORDER — KETOCONAZOLE 2 % EX CREA: TOPICAL_CREAM | CUTANEOUS | Status: DC | PRN

## 2015-05-21 MED ORDER — AMLODIPINE BESYLATE 5 MG PO TABS: 5.0000 mg | ORAL_TABLET | Freq: Every day | ORAL | Status: DC

## 2015-05-21 NOTE — Progress Notes (Signed)
Pre visit review using our clinic review tool, if applicable. No additional management support is needed unless otherwise documented below in the visit note. 

## 2015-05-21 NOTE — Progress Notes (Signed)
   Subjective:    HPI   F/u HTN - doing well  On Jan 16 Vanessa Hardy was at the party and felt clammy and faint at the party and it lasted for 24 hrs - resolved  Vanessa Hardy went to [place] soon 12/13 - Vanessa Hardy went to travel clinic for shots and dexamethasone Rx - had shots for yellow fever, typhoid and Malarone for jungles.   F/u angioedema 2 episodes - one in July 2015 and one 1 month ago - went to the hospital; h/o an allergic reaction to a raisin tart - now thinking of a possible reaction to Losartan. Losartan was stopped 1 mo ago.  The patient needs to address  chronic hypertension that has been well controlled with medicines; to address chronic  hyperlipidemia controlled with diet.  BP Readings from Last 3 Encounters:  05/21/15 128/82  01/29/15 120/76  11/17/14 130/70   Wt Readings from Last 3 Encounters:  05/21/15 168 lb 4 oz (76.318 kg)  01/29/15 171 lb (77.565 kg)  11/17/14 169 lb (76.658 kg)    Review of Systems  Constitutional: Negative for chills, activity change, appetite change, fatigue and unexpected weight change.  HENT: Negative for congestion, mouth sores and sinus pressure.   Eyes: Negative for visual disturbance.  Respiratory: Negative for cough and chest tightness.   Gastrointestinal: Negative for nausea and abdominal pain.  Genitourinary: Negative for frequency, difficulty urinating and vaginal pain.  Musculoskeletal: Negative for back pain and gait problem.  Skin: Negative for pallor and rash.  Neurological: Negative for dizziness, tremors, weakness, numbness and headaches.  Psychiatric/Behavioral: Negative for confusion and sleep disturbance.       Objective:   Physical Exam  Constitutional: She appears well-developed. No distress.  HENT:  Head: Normocephalic.  Right Ear: External ear normal.  Left Ear: External ear normal.  Nose: Nose normal.  Mouth/Throat: Oropharynx is clear and moist.  Eyes: Conjunctivae are normal. Pupils are equal, round, and reactive to  light. Right eye exhibits no discharge. Left eye exhibits no discharge.  Neck: Normal range of motion. Neck supple. No JVD present. No tracheal deviation present. No thyromegaly present.  Cardiovascular: Normal rate, regular rhythm and normal heart sounds.   Pulmonary/Chest: No stridor. No respiratory distress. She has no wheezes.  Abdominal: Soft. Bowel sounds are normal. She exhibits no distension and no mass. There is no tenderness. There is no rebound and no guarding.  Musculoskeletal: She exhibits no edema or tenderness.  Lymphadenopathy:    She has no cervical adenopathy.  Neurological: She displays normal reflexes. No cranial nerve deficit. She exhibits normal muscle tone. Coordination normal.  Skin: No rash noted. No erythema.  Psychiatric: She has a normal mood and affect. Her behavior is normal. Judgment and thought content normal.  Onycho B feet and scaly skin   Lab Results  Component Value Date   WBC 7.4 11/17/2014   HGB 13.9 11/17/2014   HCT 42.2 11/17/2014   PLT 257.0 11/17/2014   GLUCOSE 95 11/17/2014   CHOL 202* 09/21/2014   TRIG 78.0 09/21/2014   HDL 51.30 09/21/2014   LDLDIRECT 159.4 09/13/2010   LDLCALC 135* 09/21/2014   ALT 10 09/21/2014   AST 18 09/21/2014   NA 140 11/17/2014   K 4.5 11/17/2014   CL 108 11/17/2014   CREATININE 0.9 11/17/2014   BUN 19 11/17/2014   CO2 25 11/17/2014   TSH 2.85 09/21/2014      EKG      Assessment & Plan:

## 2015-07-20 ENCOUNTER — Encounter: Payer: Self-pay | Admitting: Gastroenterology

## 2015-09-24 ENCOUNTER — Telehealth: Payer: Self-pay | Admitting: Internal Medicine

## 2015-09-24 MED ORDER — AMLODIPINE BESYLATE 5 MG PO TABS: 5.0000 mg | ORAL_TABLET | Freq: Every day | ORAL | Status: DC

## 2015-09-24 NOTE — Telephone Encounter (Signed)
Patient requesting refill for amLODipine (NORVASC) 5 MG tablet [[id number] Pharmacy is [facility] on [address]

## 2015-09-24 NOTE — Telephone Encounter (Signed)
Refill sent to [facility].../lmb 

## 2015-11-22 ENCOUNTER — Other Ambulatory Visit (INDEPENDENT_AMBULATORY_CARE_PROVIDER_SITE_OTHER): Payer: Medicare Other

## 2015-11-22 ENCOUNTER — Encounter: Payer: Self-pay | Admitting: Internal Medicine

## 2015-11-22 ENCOUNTER — Ambulatory Visit (INDEPENDENT_AMBULATORY_CARE_PROVIDER_SITE_OTHER): Payer: Medicare Other | Admitting: Internal Medicine

## 2015-11-22 VITALS — BP 120/70 | HR 82 | Ht 66.0 in | Wt 171.0 lb

## 2015-11-22 DIAGNOSIS — Z0000 Encounter for general adult medical examination without abnormal findings: Secondary | ICD-10-CM

## 2015-11-22 DIAGNOSIS — E785 Hyperlipidemia, unspecified: Secondary | ICD-10-CM | POA: Diagnosis not present

## 2015-11-22 DIAGNOSIS — Z23 Encounter for immunization: Secondary | ICD-10-CM

## 2015-11-22 DIAGNOSIS — I10 Essential (primary) hypertension: Secondary | ICD-10-CM

## 2015-11-22 LAB — BASIC METABOLIC PANEL
BUN: 25 mg/dL — ABNORMAL HIGH (ref 6–23)
CALCIUM: 9.8 mg/dL (ref 8.4–10.5)
CO2: 25 meq/L (ref 19–32)
Chloride: 108 mEq/L (ref 96–112)
Creatinine, Ser: 0.91 mg/dL (ref 0.40–1.20)
GFR: 65.23 mL/min (ref >60.00)
Glucose, Bld: 89 mg/dL (ref 70–99)
Potassium: 4.5 mEq/L (ref 3.5–5.1)
SODIUM: 141 meq/L (ref 135–145)

## 2015-11-22 LAB — URINALYSIS
Bilirubin Urine: NEGATIVE
HGB URINE DIPSTICK: NEGATIVE
Ketones, ur: NEGATIVE
LEUKOCYTES UA: NEGATIVE
NITRITE: NEGATIVE
Total Protein, Urine: NEGATIVE
UROBILINOGEN UA: 0.2 (ref 0.0–1.0)
Urine Glucose: NEGATIVE
pH: 5.5 (ref 5.0–8.0)

## 2015-11-22 LAB — CBC WITH DIFFERENTIAL/PLATELET
BASOS PCT: 0.4 % (ref 0.0–3.0)
Basophils Absolute: 0 10*3/uL (ref 0.0–0.1)
EOS PCT: 3.1 % (ref 0.0–5.0)
Eosinophils Absolute: 0.3 10*3/uL (ref 0.0–0.7)
HEMATOCRIT: 41.5 % (ref 36.0–46.0)
HEMOGLOBIN: 14 g/dL (ref 12.0–15.0)
LYMPHS PCT: 20.1 % (ref 12.0–46.0)
Lymphs Abs: 1.8 10*3/uL (ref 0.7–4.0)
MCHC: 33.6 g/dL (ref 30.0–36.0)
MCV: 93.4 fl (ref 78.0–100.0)
MONOS PCT: 6 % (ref 3.0–12.0)
Monocytes Absolute: 0.5 10*3/uL (ref 0.1–1.0)
Neutro Abs: 6.1 10*3/uL (ref 1.4–7.7)
Neutrophils Relative %: 70.4 % (ref 43.0–77.0)
Platelets: 266 10*3/uL (ref 150.0–400.0)
RBC: 4.45 Mil/uL (ref 3.87–5.11)
RDW: 13.2 % (ref 11.5–15.5)
WBC: 8.7 10*3/uL (ref 4.0–10.5)

## 2015-11-22 LAB — LIPID PANEL
CHOL/HDL RATIO: 4
CHOLESTEROL: 187 mg/dL (ref 0–200)
HDL: 46.4 mg/dL (ref >39.00)
LDL Cholesterol: 128 mg/dL — ABNORMAL HIGH (ref 0–99)
NonHDL: 140.39
TRIGLYCERIDES: 62 mg/dL (ref 0.0–149.0)
VLDL: 12.4 mg/dL (ref 0.0–40.0)

## 2015-11-22 LAB — HEPATIC FUNCTION PANEL
ALT: 10 U/L (ref 0–35)
AST: 13 U/L (ref 0–37)
Albumin: 4.2 g/dL (ref 3.5–5.2)
Alkaline Phosphatase: 49 U/L (ref 39–117)
Bilirubin, Direct: 0.2 mg/dL (ref 0.0–0.3)
TOTAL PROTEIN: 7.4 g/dL (ref 6.0–8.3)
Total Bilirubin: 0.6 mg/dL (ref 0.2–1.2)

## 2015-11-22 LAB — TSH: TSH: 2.27 u[IU]/mL (ref 0.35–4.50)

## 2015-11-22 MED ORDER — SPIRONOLACTONE 25 MG PO TABS: 50.0000 mg | ORAL_TABLET | Freq: Every day | ORAL | Status: DC

## 2015-11-22 MED ORDER — AMLODIPINE BESYLATE 5 MG PO TABS: 5.0000 mg | ORAL_TABLET | Freq: Every day | ORAL | Status: DC

## 2015-11-22 NOTE — Progress Notes (Signed)
Subjective:  Patient ID: Vanessa Hardy, female    DOB: 1946-06-14  Age: 69 y.o. MRN: [id number]  CC: No chief complaint on file.   HPI Vanessa Hardy presents for a well exam. F/u HTN  Outpatient Prescriptions Prior to Visit  Medication Sig Dispense Refill  . aspirin 325 MG tablet Take 325 mg by mouth daily.      . ciclopirox (PENLAC) 8 % solution Apply topically at bedtime. Apply over nail and surrounding skin. Apply daily over previous coat. After seven (7) days, may remove with alcohol and continue cycle. 6.6 mL 1  . ketoconazole (NIZORAL) 2 % cream Apply topically as needed. 150 g 3  . amLODipine (NORVASC) 5 MG tablet Take 1 tablet (5 mg total) by mouth daily. 90 tablet 2  . spironolactone (ALDACTONE) 25 MG tablet Take 2 tablets (50 mg total) by mouth daily. 180 tablet 3   No facility-administered medications prior to visit.    ROS Review of Systems  Constitutional: Negative for chills, activity change, appetite change, fatigue and unexpected weight change.  HENT: Negative for congestion, mouth sores and sinus pressure.   Eyes: Negative for visual disturbance.  Respiratory: Negative for cough and chest tightness.   Gastrointestinal: Negative for nausea and abdominal pain.  Genitourinary: Negative for frequency, difficulty urinating and vaginal pain.  Musculoskeletal: Negative for back pain and gait problem.  Skin: Negative for pallor and rash.  Neurological: Negative for dizziness, tremors, weakness, numbness and headaches.  Psychiatric/Behavioral: Negative for suicidal ideas, confusion and sleep disturbance.    Objective:  BP 120/70 mmHg  Pulse 82  Ht 5\' 6"  (1.676 m)  Wt 171 lb (77.565 kg)  BMI 27.61 kg/m2  SpO2 94%  BP Readings from Last 3 Encounters:  11/22/15 120/70  05/21/15 128/82  01/29/15 120/76    Wt Readings from Last 3 Encounters:  11/22/15 171 lb (77.565 kg)  05/21/15 168 lb 4 oz (76.318 kg)  01/29/15 171 lb (77.565 kg)    Physical Exam    Constitutional: She appears well-developed. No distress.  HENT:  Head: Normocephalic.  Right Ear: External ear normal.  Left Ear: External ear normal.  Nose: Nose normal.  Mouth/Throat: Oropharynx is clear and moist. No oropharyngeal exudate.  Eyes: Conjunctivae are normal. Pupils are equal, round, and reactive to light. Right eye exhibits no discharge. Left eye exhibits no discharge.  Neck: Normal range of motion. Neck supple. No JVD present. No tracheal deviation present. No thyromegaly present.  Cardiovascular: Normal rate, regular rhythm and normal heart sounds.   Pulmonary/Chest: No stridor. No respiratory distress. She has no wheezes.  Abdominal: Soft. Bowel sounds are normal. She exhibits no distension and no mass. There is no tenderness. There is no rebound and no guarding.  Musculoskeletal: She exhibits no edema or tenderness.  Lymphadenopathy:    She has no cervical adenopathy.  Neurological: She displays normal reflexes. No cranial nerve deficit. She exhibits normal muscle tone. Coordination normal.  Skin: No rash noted. No erythema.  Psychiatric: She has a normal mood and affect. Her behavior is normal. Judgment and thought content normal.    Lab Results  Component Value Date   WBC 8.7 11/22/2015   HGB 14.0 11/22/2015   HCT 41.5 11/22/2015   PLT 266.0 11/22/2015   GLUCOSE 89 11/22/2015   CHOL 187 11/22/2015   TRIG 62.0 11/22/2015   HDL 46.40 11/22/2015   LDLDIRECT 159.4 09/13/2010   LDLCALC 128* 11/22/2015   ALT 10 11/22/2015   AST 13  11/22/2015   NA 141 11/22/2015   K 4.5 11/22/2015   CL 108 11/22/2015   CREATININE 0.91 11/22/2015   BUN 25* 11/22/2015   CO2 25 11/22/2015   TSH 2.27 11/22/2015    Mm Digital Screening Bilateral  01/28/2015  CLINICAL DATA:  Screening. EXAM: DIGITAL SCREENING BILATERAL MAMMOGRAM WITH CAD COMPARISON:  Previous exam(s). ACR Breast Density Category b: There are scattered areas of fibroglandular density. FINDINGS: There are no  findings suspicious for malignancy. Images were processed with CAD. IMPRESSION: No mammographic evidence of malignancy. A result letter of this screening mammogram will be mailed directly to the patient. RECOMMENDATION: Screening mammogram in one year. (Code:SM-B-01Y) BI-RADS CATEGORY  1: Negative. Electronically Signed   By: Evangeline Dakin M.D.   On: 01/28/2015 12:49    Assessment & Plan:   Diagnoses and all orders for this visit:  Essential hypertension -     Basic metabolic panel; Future -     Lipid panel; Future -     TSH; Future -     Urinalysis; Future -     CBC with Differential/Platelet; Future -     Hepatic function panel; Future  Dyslipidemia -     Urinalysis; Future -     CBC with Differential/Platelet; Future -     Hepatic function panel; Future -     Hepatitis C antibody; Future  Well adult exam -     Urinalysis; Future -     CBC with Differential/Platelet; Future -     Hepatic function panel; Future -     Hepatitis C antibody; Future  Need for influenza vaccination -     Flu vaccine HIGH DOSE PF  Other orders -     amLODipine (NORVASC) 5 MG tablet; Take 1 tablet (5 mg total) by mouth daily. -     spironolactone (ALDACTONE) 25 MG tablet; Take 2 tablets (50 mg total) by mouth daily.   I am having Ms. Switalski maintain her aspirin, ciclopirox, ketoconazole, amLODipine, and spironolactone.  Meds ordered this encounter  Medications  . amLODipine (NORVASC) 5 MG tablet    Sig: Take 1 tablet (5 mg total) by mouth daily.    Dispense:  90 tablet    Refill:  3  . spironolactone (ALDACTONE) 25 MG tablet    Sig: Take 2 tablets (50 mg total) by mouth daily.    Dispense:  180 tablet    Refill:  3     Follow-up: Return in about 6 months (around 05/22/2016) for a follow-up visit.  Walker Kehr, MD

## 2015-11-22 NOTE — Patient Instructions (Signed)
Preventive Care for Adults, Female A healthy lifestyle and preventive care can promote health and wellness. Preventive health guidelines for women include the following key practices.  A routine yearly physical is a good way to check with your health care provider about your health and preventive screening. It is a chance to share any concerns and updates on your health and to receive a thorough exam.  Visit your dentist for a routine exam and preventive care every 6 months. Brush your teeth twice a day and floss once a day. Good oral hygiene prevents tooth decay and gum disease.  The frequency of eye exams is based on your age, health, family medical history, use of contact lenses, and other factors. Follow your health care provider's recommendations for frequency of eye exams.  Eat a healthy diet. Foods like vegetables, fruits, whole grains, low-fat dairy products, and lean protein foods contain the nutrients you need without too many calories. Decrease your intake of foods high in solid fats, added sugars, and salt. Eat the right amount of calories for you.Get information about a proper diet from your health care provider, if necessary.  Regular physical exercise is one of the most important things you can do for your health. Most adults should get at least 150 minutes of moderate-intensity exercise (any activity that increases your heart rate and causes you to sweat) each week. In addition, most adults need muscle-strengthening exercises on 2 or more days a week.  Maintain a healthy weight. The body mass index (BMI) is a screening tool to identify possible weight problems. It provides an estimate of body fat based on height and weight. Your health care provider can find your BMI and can help you achieve or maintain a healthy weight.For adults 20 years and older:  A BMI below 18.5 is considered underweight.  A BMI of 18.5 to 24.9 is normal.  A BMI of 25 to 29.9 is considered overweight.  A  BMI of 30 and above is considered obese.  Maintain normal blood lipids and cholesterol levels by exercising and minimizing your intake of saturated fat. Eat a balanced diet with plenty of fruit and vegetables. Blood tests for lipids and cholesterol should begin at age 45 and be repeated every 5 years. If your lipid or cholesterol levels are high, you are over 50, or you are at high risk for heart disease, you may need your cholesterol levels checked more frequently.Ongoing high lipid and cholesterol levels should be treated with medicines if diet and exercise are not working.  If you smoke, find out from your health care provider how to quit. If you do not use tobacco, do not start.  Lung cancer screening is recommended for adults aged 45-80 years who are at high risk for developing lung cancer because of a history of smoking. A yearly low-dose CT scan of the lungs is recommended for people who have at least a 30-pack-year history of smoking and are a current smoker or have quit within the past 15 years. A pack year of smoking is smoking an average of 1 pack of cigarettes a day for 1 year (for example: 1 pack a day for 30 years or 2 packs a day for 15 years). Yearly screening should continue until the smoker has stopped smoking for at least 15 years. Yearly screening should be stopped for people who develop a health problem that would prevent them from having lung cancer treatment.  If you are pregnant, do not drink alcohol. If you are  breastfeeding, be very cautious about drinking alcohol. If you are not pregnant and choose to drink alcohol, do not have more than 1 drink per day. One drink is considered to be 12 ounces (355 mL) of beer, 5 ounces (148 mL) of wine, or 1.5 ounces (44 mL) of liquor.  Avoid use of street drugs. Do not share needles with anyone. Ask for help if you need support or instructions about stopping the use of drugs.  High blood pressure causes heart disease and increases the risk  of stroke. Your blood pressure should be checked at least every 1 to 2 years. Ongoing high blood pressure should be treated with medicines if weight loss and exercise do not work.  If you are 55-79 years old, ask your health care provider if you should take aspirin to prevent strokes.  Diabetes screening is done by taking a blood sample to check your blood glucose level after you have not eaten for a certain period of time (fasting). If you are not overweight and you do not have risk factors for diabetes, you should be screened once every 3 years starting at age 45. If you are overweight or obese and you are 40-70 years of age, you should be screened for diabetes every year as part of your cardiovascular risk assessment.  Breast cancer screening is essential preventive care for women. You should practice "breast self-awareness." This means understanding the normal appearance and feel of your breasts and may include breast self-examination. Any changes detected, no matter how small, should be reported to a health care provider. Women in their 20s and 30s should have a clinical breast exam (CBE) by a health care provider as part of a regular health exam every 1 to 3 years. After age 40, women should have a CBE every year. Starting at age 40, women should consider having a mammogram (breast X-ray test) every year. Women who have a family history of breast cancer should talk to their health care provider about genetic screening. Women at a high risk of breast cancer should talk to their health care providers about having an MRI and a mammogram every year.  Breast cancer gene (BRCA)-related cancer risk assessment is recommended for women who have family members with BRCA-related cancers. BRCA-related cancers include breast, ovarian, tubal, and peritoneal cancers. Having family members with these cancers may be associated with an increased risk for harmful changes (mutations) in the breast cancer genes BRCA1 and  BRCA2. Results of the assessment will determine the need for genetic counseling and BRCA1 and BRCA2 testing.  Your health care provider may recommend that you be screened regularly for cancer of the pelvic organs (ovaries, uterus, and vagina). This screening involves a pelvic examination, including checking for microscopic changes to the surface of your cervix (Pap test). You may be encouraged to have this screening done every 3 years, beginning at age 21.  For women ages 30-65, health care providers may recommend pelvic exams and Pap testing every 3 years, or they may recommend the Pap and pelvic exam, combined with testing for human papilloma virus (HPV), every 5 years. Some types of HPV increase your risk of cervical cancer. Testing for HPV may also be done on women of any age with unclear Pap test results.  Other health care providers may not recommend any screening for nonpregnant women who are considered low risk for pelvic cancer and who do not have symptoms. Ask your health care provider if a screening pelvic exam is right for   you.  If you have had past treatment for cervical cancer or a condition that could lead to cancer, you need Pap tests and screening for cancer for at least 20 years after your treatment. If Pap tests have been discontinued, your risk factors (such as having a new sexual partner) need to be reassessed to determine if screening should resume. Some women have medical problems that increase the chance of getting cervical cancer. In these cases, your health care provider may recommend more frequent screening and Pap tests.  Colorectal cancer can be detected and often prevented. Most routine colorectal cancer screening begins at the age of 50 years and continues through age 75 years. However, your health care provider may recommend screening at an earlier age if you have risk factors for colon cancer. On a yearly basis, your health care provider may provide home test kits to check  for hidden blood in the stool. Use of a small camera at the end of a tube, to directly examine the colon (sigmoidoscopy or colonoscopy), can detect the earliest forms of colorectal cancer. Talk to your health care provider about this at age 50, when routine screening begins. Direct exam of the colon should be repeated every 5-10 years through age 75 years, unless early forms of precancerous polyps or small growths are found.  People who are at an increased risk for hepatitis B should be screened for this virus. You are considered at high risk for hepatitis B if:  You were born in a country where hepatitis B occurs often. Talk with your health care provider about which countries are considered high risk.  Your parents were born in a high-risk country and you have not received a shot to protect against hepatitis B (hepatitis B vaccine).  You have HIV or AIDS.  You use needles to inject street drugs.  You live with, or have sex with, someone who has hepatitis B.  You get hemodialysis treatment.  You take certain medicines for conditions like cancer, organ transplantation, and autoimmune conditions.  Hepatitis C blood testing is recommended for all people born from 1945 through 1965 and any individual with known risks for hepatitis C.  Practice safe sex. Use condoms and avoid high-risk sexual practices to reduce the spread of sexually transmitted infections (STIs). STIs include gonorrhea, chlamydia, syphilis, trichomonas, herpes, HPV, and human immunodeficiency virus (HIV). Herpes, HIV, and HPV are viral illnesses that have no cure. They can result in disability, cancer, and death.  You should be screened for sexually transmitted illnesses (STIs) including gonorrhea and chlamydia if:  You are sexually active and are younger than 24 years.  You are older than 24 years and your health care provider tells you that you are at risk for this type of infection.  Your sexual activity has changed  since you were last screened and you are at an increased risk for chlamydia or gonorrhea. Ask your health care provider if you are at risk.  If you are at risk of being infected with HIV, it is recommended that you take a prescription medicine daily to prevent HIV infection. This is called preexposure prophylaxis (PrEP). You are considered at risk if:  You are sexually active and do not regularly use condoms or know the HIV status of your partner(s).  You take drugs by injection.  You are sexually active with a partner who has HIV.  Talk with your health care provider about whether you are at high risk of being infected with HIV. If   you choose to begin PrEP, you should first be tested for HIV. You should then be tested every 3 months for as long as you are taking PrEP.  Osteoporosis is a disease in which the bones lose minerals and strength with aging. This can result in serious bone fractures or breaks. The risk of osteoporosis can be identified using a bone density scan. Women ages 67 years and over and women at risk for fractures or osteoporosis should discuss screening with their health care providers. Ask your health care provider whether you should take a calcium supplement or vitamin D to reduce the rate of osteoporosis.  Menopause can be associated with physical symptoms and risks. Hormone replacement therapy is available to decrease symptoms and risks. You should talk to your health care provider about whether hormone replacement therapy is right for you.  Use sunscreen. Apply sunscreen liberally and repeatedly throughout the day. You should seek shade when your shadow is shorter than you. Protect yourself by wearing long sleeves, pants, a wide-brimmed hat, and sunglasses year round, whenever you are outdoors.  Once a month, do a whole body skin exam, using a mirror to look at the skin on your back. Tell your health care provider of new moles, moles that have irregular borders, moles that  are larger than a pencil eraser, or moles that have changed in shape or color.  Stay current with required vaccines (immunizations).  Influenza vaccine. All adults should be immunized every year.  Tetanus, diphtheria, and acellular pertussis (Td, Tdap) vaccine. Pregnant women should receive 1 dose of Tdap vaccine during each pregnancy. The dose should be obtained regardless of the length of time since the last dose. Immunization is preferred during the 27th-36th week of gestation. An adult who has not previously received Tdap or who does not know her vaccine status should receive 1 dose of Tdap. This initial dose should be followed by tetanus and diphtheria toxoids (Td) booster doses every 10 years. Adults with an unknown or incomplete history of completing a 3-dose immunization series with Td-containing vaccines should begin or complete a primary immunization series including a Tdap dose. Adults should receive a Td booster every 10 years.  Varicella vaccine. An adult without evidence of immunity to varicella should receive 2 doses or a second dose if she has previously received 1 dose. Pregnant females who do not have evidence of immunity should receive the first dose after pregnancy. This first dose should be obtained before leaving the health care facility. The second dose should be obtained 4-8 weeks after the first dose.  Human papillomavirus (HPV) vaccine. Females aged 13-26 years who have not received the vaccine previously should obtain the 3-dose series. The vaccine is not recommended for use in pregnant females. However, pregnancy testing is not needed before receiving a dose. If a female is found to be pregnant after receiving a dose, no treatment is needed. In that case, the remaining doses should be delayed until after the pregnancy. Immunization is recommended for any person with an immunocompromised condition through the age of 61 years if she did not get any or all doses earlier. During the  3-dose series, the second dose should be obtained 4-8 weeks after the first dose. The third dose should be obtained 24 weeks after the first dose and 16 weeks after the second dose.  Zoster vaccine. One dose is recommended for adults aged 30 years or older unless certain conditions are present.  Measles, mumps, and rubella (MMR) vaccine. Adults born  before 1957 generally are considered immune to measles and mumps. Adults born in 1957 or later should have 1 or more doses of MMR vaccine unless there is a contraindication to the vaccine or there is laboratory evidence of immunity to each of the three diseases. A routine second dose of MMR vaccine should be obtained at least 28 days after the first dose for students attending postsecondary schools, health care workers, or international travelers. People who received inactivated measles vaccine or an unknown type of measles vaccine during 1963-1967 should receive 2 doses of MMR vaccine. People who received inactivated mumps vaccine or an unknown type of mumps vaccine before 1979 and are at high risk for mumps infection should consider immunization with 2 doses of MMR vaccine. For females of childbearing age, rubella immunity should be determined. If there is no evidence of immunity, females who are not pregnant should be vaccinated. If there is no evidence of immunity, females who are pregnant should delay immunization until after pregnancy. Unvaccinated health care workers born before 1957 who lack laboratory evidence of measles, mumps, or rubella immunity or laboratory confirmation of disease should consider measles and mumps immunization with 2 doses of MMR vaccine or rubella immunization with 1 dose of MMR vaccine.  Pneumococcal 13-valent conjugate (PCV13) vaccine. When indicated, a person who is uncertain of his immunization history and has no record of immunization should receive the PCV13 vaccine. All adults 65 years of age and older should receive this  vaccine. An adult aged 19 years or older who has certain medical conditions and has not been previously immunized should receive 1 dose of PCV13 vaccine. This PCV13 should be followed with a dose of pneumococcal polysaccharide (PPSV23) vaccine. Adults who are at high risk for pneumococcal disease should obtain the PPSV23 vaccine at least 8 weeks after the dose of PCV13 vaccine. Adults older than 68 years of age who have normal immune system function should obtain the PPSV23 vaccine dose at least 1 year after the dose of PCV13 vaccine.  Pneumococcal polysaccharide (PPSV23) vaccine. When PCV13 is also indicated, PCV13 should be obtained first. All adults aged 65 years and older should be immunized. An adult younger than age 65 years who has certain medical conditions should be immunized. Any person who resides in a nursing home or long-term care facility should be immunized. An adult smoker should be immunized. People with an immunocompromised condition and certain other conditions should receive both PCV13 and PPSV23 vaccines. People with human immunodeficiency virus (HIV) infection should be immunized as soon as possible after diagnosis. Immunization during chemotherapy or radiation therapy should be avoided. Routine use of PPSV23 vaccine is not recommended for American Indians, [place] Natives, or people younger than 65 years unless there are medical conditions that require PPSV23 vaccine. When indicated, people who have unknown immunization and have no record of immunization should receive PPSV23 vaccine. One-time revaccination 5 years after the first dose of PPSV23 is recommended for people aged 19-64 years who have chronic kidney failure, nephrotic syndrome, asplenia, or immunocompromised conditions. People who received 1-2 doses of PPSV23 before age 65 years should receive another dose of PPSV23 vaccine at age 65 years or later if at least 5 years have passed since the previous dose. Doses of PPSV23 are not  needed for people immunized with PPSV23 at or after age 65 years.  Meningococcal vaccine. Adults with asplenia or persistent complement component deficiencies should receive 2 doses of quadrivalent meningococcal conjugate (MenACWY-D) vaccine. The doses should be obtained   at least 2 months apart. Microbiologists working with certain meningococcal bacteria, [place] recruits, people at risk during an outbreak, and people who travel to or live in countries with a high rate of meningitis should be immunized. A first-year college student up through age 34 years who is living in a residence hall should receive a dose if she did not receive a dose on or after her 16th birthday. Adults who have certain high-risk conditions should receive one or more doses of vaccine.  Hepatitis A vaccine. Adults who wish to be protected from this disease, have certain high-risk conditions, work with hepatitis A-infected animals, work in hepatitis A research labs, or travel to or work in countries with a high rate of hepatitis A should be immunized. Adults who were previously unvaccinated and who anticipate close contact with an international adoptee during the first 60 days after arrival in the [place] from a country with a high rate of hepatitis A should be immunized.  Hepatitis B vaccine. Adults who wish to be protected from this disease, have certain high-risk conditions, may be exposed to blood or other infectious body fluids, are household contacts or sex partners of hepatitis B positive people, are clients or workers in certain care facilities, or travel to or work in countries with a high rate of hepatitis B should be immunized.  Haemophilus influenzae type b (Hib) vaccine. A previously unvaccinated person with asplenia or sickle cell disease or having a scheduled splenectomy should receive 1 dose of Hib vaccine. Regardless of previous immunization, a recipient of a hematopoietic stem cell transplant should receive a  3-dose series 6-12 months after her successful transplant. Hib vaccine is not recommended for adults with HIV infection. Preventive Services / Frequency Ages 35 to 4 years  Blood pressure check.** / Every 3-5 years.  Lipid and cholesterol check.** / Every 5 years beginning at age 60.  Clinical breast exam.** / Every 3 years for women in their 71s and 10s.  BRCA-related cancer risk assessment.** / For women who have family members with a BRCA-related cancer (breast, ovarian, tubal, or peritoneal cancers).  Pap test.** / Every 2 years from ages 76 through 26. Every 3 years starting at age 61 through age 76 or 93 with a history of 3 consecutive normal Pap tests.  HPV screening.** / Every 3 years from ages 37 through ages 60 to 51 with a history of 3 consecutive normal Pap tests.  Hepatitis C blood test.** / For any individual with known risks for hepatitis C.  Skin self-exam. / Monthly.  Influenza vaccine. / Every year.  Tetanus, diphtheria, and acellular pertussis (Tdap, Td) vaccine.** / Consult your health care provider. Pregnant women should receive 1 dose of Tdap vaccine during each pregnancy. 1 dose of Td every 10 years.  Varicella vaccine.** / Consult your health care provider. Pregnant females who do not have evidence of immunity should receive the first dose after pregnancy.  HPV vaccine. / 3 doses over 6 months, if 93 and younger. The vaccine is not recommended for use in pregnant females. However, pregnancy testing is not needed before receiving a dose.  Measles, mumps, rubella (MMR) vaccine.** / You need at least 1 dose of MMR if you were born in 1957 or later. You may also need a 2nd dose. For females of childbearing age, rubella immunity should be determined. If there is no evidence of immunity, females who are not pregnant should be vaccinated. If there is no evidence of immunity, females who are  pregnant should delay immunization until after pregnancy.  Pneumococcal  13-valent conjugate (PCV13) vaccine.** / Consult your health care provider.  Pneumococcal polysaccharide (PPSV23) vaccine.** / 1 to 2 doses if you smoke cigarettes or if you have certain conditions.  Meningococcal vaccine.** / 1 dose if you are age 68 to 8 years and a Market researcher living in a residence hall, or have one of several medical conditions, you need to get vaccinated against meningococcal disease. You may also need additional booster doses.  Hepatitis A vaccine.** / Consult your health care provider.  Hepatitis B vaccine.** / Consult your health care provider.  Haemophilus influenzae type b (Hib) vaccine.** / Consult your health care provider. Ages 7 to 53 years  Blood pressure check.** / Every year.  Lipid and cholesterol check.** / Every 5 years beginning at age 25 years.  Lung cancer screening. / Every year if you are aged 11-80 years and have a 30-pack-year history of smoking and currently smoke or have quit within the past 15 years. Yearly screening is stopped once you have quit smoking for at least 15 years or develop a health problem that would prevent you from having lung cancer treatment.  Clinical breast exam.** / Every year after age 48 years.  BRCA-related cancer risk assessment.** / For women who have family members with a BRCA-related cancer (breast, ovarian, tubal, or peritoneal cancers).  Mammogram.** / Every year beginning at age 41 years and continuing for as long as you are in good health. Consult with your health care provider.  Pap test.** / Every 3 years starting at age 65 years through age 37 or 70 years with a history of 3 consecutive normal Pap tests.  HPV screening.** / Every 3 years from ages 72 years through ages 60 to 40 years with a history of 3 consecutive normal Pap tests.  Fecal occult blood test (FOBT) of stool. / Every year beginning at age 21 years and continuing until age 5 years. You may not need to do this test if you get  a colonoscopy every 10 years.  Flexible sigmoidoscopy or colonoscopy.** / Every 5 years for a flexible sigmoidoscopy or every 10 years for a colonoscopy beginning at age 35 years and continuing until age 48 years.  Hepatitis C blood test.** / For all people born from [birth year] through 1965 and any individual with known risks for hepatitis C.  Skin self-exam. / Monthly.  Influenza vaccine. / Every year.  Tetanus, diphtheria, and acellular pertussis (Tdap/Td) vaccine.** / Consult your health care provider. Pregnant women should receive 1 dose of Tdap vaccine during each pregnancy. 1 dose of Td every 10 years.  Varicella vaccine.** / Consult your health care provider. Pregnant females who do not have evidence of immunity should receive the first dose after pregnancy.  Zoster vaccine.** / 1 dose for adults aged 30 years or older.  Measles, mumps, rubella (MMR) vaccine.** / You need at least 1 dose of MMR if you were born in 1957 or later. You may also need a second dose. For females of childbearing age, rubella immunity should be determined. If there is no evidence of immunity, females who are not pregnant should be vaccinated. If there is no evidence of immunity, females who are pregnant should delay immunization until after pregnancy.  Pneumococcal 13-valent conjugate (PCV13) vaccine.** / Consult your health care provider.  Pneumococcal polysaccharide (PPSV23) vaccine.** / 1 to 2 doses if you smoke cigarettes or if you have certain conditions.  Meningococcal vaccine.** /  Consult your health care provider.  Hepatitis A vaccine.** / Consult your health care provider.  Hepatitis B vaccine.** / Consult your health care provider.  Haemophilus influenzae type b (Hib) vaccine.** / Consult your health care provider. Ages 64 years and over  Blood pressure check.** / Every year.  Lipid and cholesterol check.** / Every 5 years beginning at age 23 years.  Lung cancer screening. / Every year if you  are aged 16-80 years and have a 30-pack-year history of smoking and currently smoke or have quit within the past 15 years. Yearly screening is stopped once you have quit smoking for at least 15 years or develop a health problem that would prevent you from having lung cancer treatment.  Clinical breast exam.** / Every year after age 74 years.  BRCA-related cancer risk assessment.** / For women who have family members with a BRCA-related cancer (breast, ovarian, tubal, or peritoneal cancers).  Mammogram.** / Every year beginning at age 44 years and continuing for as long as you are in good health. Consult with your health care provider.  Pap test.** / Every 3 years starting at age 58 years through age 22 or 39 years with 3 consecutive normal Pap tests. Testing can be stopped between 65 and 70 years with 3 consecutive normal Pap tests and no abnormal Pap or HPV tests in the past 10 years.  HPV screening.** / Every 3 years from ages 64 years through ages 70 or 61 years with a history of 3 consecutive normal Pap tests. Testing can be stopped between 65 and 70 years with 3 consecutive normal Pap tests and no abnormal Pap or HPV tests in the past 10 years.  Fecal occult blood test (FOBT) of stool. / Every year beginning at age 40 years and continuing until age 27 years. You may not need to do this test if you get a colonoscopy every 10 years.  Flexible sigmoidoscopy or colonoscopy.** / Every 5 years for a flexible sigmoidoscopy or every 10 years for a colonoscopy beginning at age 7 years and continuing until age 32 years.  Hepatitis C blood test.** / For all people born from [birth year] through 1964 and any individual with known risks for hepatitis C.  Osteoporosis screening.** / A one-time screening for women ages 30 years and over and women at risk for fractures or osteoporosis.  Skin self-exam. / Monthly.  Influenza vaccine. / Every year.  Tetanus, diphtheria, and acellular pertussis (Tdap/Td)  vaccine.** / 1 dose of Td every 10 years.  Varicella vaccine.** / Consult your health care provider.  Zoster vaccine.** / 1 dose for adults aged 35 years or older.  Pneumococcal 13-valent conjugate (PCV13) vaccine.** / Consult your health care provider.  Pneumococcal polysaccharide (PPSV23) vaccine.** / 1 dose for all adults aged 46 years and older.  Meningococcal vaccine.** / Consult your health care provider.  Hepatitis A vaccine.** / Consult your health care provider.  Hepatitis B vaccine.** / Consult your health care provider.  Haemophilus influenzae type b (Hib) vaccine.** / Consult your health care provider. ** Family history and personal history of risk and conditions may change your health care provider's recommendations.   This information is not intended to replace advice given to you by your health care provider. Make sure you discuss any questions you have with your health care provider.   Document Released: 01/30/2002 Document Revised: 12/25/2014 Document Reviewed: 05/01/2011 Elsevier Interactive Patient Education [employer].

## 2015-11-22 NOTE — Assessment & Plan Note (Signed)
Spironolactone, Amlodipine 

## 2015-11-22 NOTE — Assessment & Plan Note (Signed)
Here for medicare wellness/physical  Diet: heart healthy  Physical activity: not sedentary  Depression/mood screen: stress  Hearing: intact to whispered voice  Visual acuity: grossly normal, performs annual eye exam  ADLs: capable  Fall risk: none  Home safety: good  Cognitive evaluation: intact to orientation, naming, recall and repetition  EOL planning: adv directives, full code/ I agree  I have personally reviewed and have noted  1. The patient's medical, surgical and social history  2. Their use of alcohol, tobacco or illicit drugs  3. Their current medications and supplements  4. The patient's functional ability including ADL's, fall risks, home safety risks and hearing or visual impairment.  5. Diet and physical activities  6. Evidence for depression or mood disorders 7. The roster of all physicians providing medical care to patient - is listed in the Snapshot section of the chart and reviewed today.    Today patient counseled on age appropriate routine health concerns for screening and prevention, each reviewed and up to date or declined. Immunizations reviewed and up to date or declined. Labs ordered and reviewed. Risk factors for depression reviewed and negative. Hearing function and visual acuity are intact. ADLs screened and addressed as needed. Functional ability and level of safety reviewed and appropriate. Education, counseling and referrals performed based on assessed risks today. Patient provided with a copy of personalized plan for preventive services.

## 2015-11-22 NOTE — Progress Notes (Signed)
Pre visit review using our clinic review tool, if applicable. No additional management support is needed unless otherwise documented below in the visit note. 

## 2015-11-22 NOTE — Assessment & Plan Note (Signed)
  On diet  

## 2015-11-23 LAB — HEPATITIS C ANTIBODY: HCV Ab: NEGATIVE

## 2015-12-31 ENCOUNTER — Other Ambulatory Visit: Payer: Self-pay

## 2015-12-31 DIAGNOSIS — Z1231 Encounter for screening mammogram for malignant neoplasm of breast: Secondary | ICD-10-CM

## 2016-01-31 ENCOUNTER — Ambulatory Visit
Admission: RE | Admit: 2016-01-31 | Discharge: 2016-01-31 | Disposition: A | Discharge status: Home / Self Care | Payer: Medicare Other | Source: Ambulatory Visit

## 2016-01-31 DIAGNOSIS — Z1231 Encounter for screening mammogram for malignant neoplasm of breast: Secondary | ICD-10-CM

## 2016-02-01 ENCOUNTER — Telehealth: Payer: Self-pay | Admitting: Internal Medicine

## 2016-02-01 NOTE — Telephone Encounter (Signed)
Patient is calling to get mammogram results from Monday. Please give the patient a call.

## 2016-02-03 NOTE — Telephone Encounter (Signed)
Have you seen Mammogram results from 01/31/16?

## 2016-02-03 NOTE — Telephone Encounter (Signed)
Left detailed mess informing pt of below.  

## 2016-02-03 NOTE — Telephone Encounter (Signed)
It is normal -  IMPRESSION: No mammographic evidence of malignancy. A result letter of this screening mammogram will be mailed directly to the patient.  RECOMMENDATION: Screening mammogram in one year. (Code:SM-B-01Y)  Thx

## 2016-04-21 DIAGNOSIS — N750 Cyst of Bartholin's gland: Secondary | ICD-10-CM | POA: Diagnosis not present

## 2016-05-22 ENCOUNTER — Ambulatory Visit (INDEPENDENT_AMBULATORY_CARE_PROVIDER_SITE_OTHER): Payer: Medicare Other | Admitting: Internal Medicine

## 2016-05-22 ENCOUNTER — Other Ambulatory Visit (INDEPENDENT_AMBULATORY_CARE_PROVIDER_SITE_OTHER): Payer: Medicare Other

## 2016-05-22 ENCOUNTER — Encounter: Payer: Self-pay | Admitting: Internal Medicine

## 2016-05-22 VITALS — BP 120/80 | HR 81 | Wt 172.0 lb

## 2016-05-22 DIAGNOSIS — F4321 Adjustment disorder with depressed mood: Secondary | ICD-10-CM

## 2016-05-22 DIAGNOSIS — N758 Other diseases of Bartholin's gland: Secondary | ICD-10-CM | POA: Insufficient documentation

## 2016-05-22 DIAGNOSIS — I10 Essential (primary) hypertension: Secondary | ICD-10-CM | POA: Diagnosis not present

## 2016-05-22 LAB — LIPID PANEL
CHOLESTEROL: 197 mg/dL (ref 0–200)
HDL: 46.9 mg/dL (ref >39.00)
LDL Cholesterol: 132 mg/dL — ABNORMAL HIGH (ref 0–99)
NonHDL: 149.78
TRIGLYCERIDES: 90 mg/dL (ref 0.0–149.0)
Total CHOL/HDL Ratio: 4
VLDL: 18 mg/dL (ref 0.0–40.0)

## 2016-05-22 LAB — BASIC METABOLIC PANEL
BUN: 28 mg/dL — ABNORMAL HIGH (ref 6–23)
CALCIUM: 9.7 mg/dL (ref 8.4–10.5)
CO2: 26 meq/L (ref 19–32)
Chloride: 108 mEq/L (ref 96–112)
Creatinine, Ser: 0.88 mg/dL (ref 0.40–1.20)
GFR: 67.7 mL/min (ref >60.00)
Glucose, Bld: 96 mg/dL (ref 70–99)
POTASSIUM: 4.6 meq/L (ref 3.5–5.1)
SODIUM: 140 meq/L (ref 135–145)

## 2016-05-22 LAB — HEPATIC FUNCTION PANEL
ALT: 13 U/L (ref 0–35)
AST: 14 U/L (ref 0–37)
Albumin: 4.3 g/dL (ref 3.5–5.2)
Alkaline Phosphatase: 44 U/L (ref 39–117)
BILIRUBIN DIRECT: 0.1 mg/dL (ref 0.0–0.3)
BILIRUBIN TOTAL: 0.7 mg/dL (ref 0.2–1.2)
Total Protein: 7.3 g/dL (ref 6.0–8.3)

## 2016-05-22 MED ORDER — KETOCONAZOLE 2 % EX CREA: TOPICAL_CREAM | CUTANEOUS | Status: AC | PRN

## 2016-05-22 MED ORDER — AMLODIPINE BESYLATE 5 MG PO TABS: 5.0000 mg | ORAL_TABLET | Freq: Every day | ORAL | Status: AC

## 2016-05-22 MED ORDER — SPIRONOLACTONE 25 MG PO TABS: 50.0000 mg | ORAL_TABLET | Freq: Every day | ORAL | Status: DC

## 2016-05-22 MED ORDER — DOXYCYCLINE HYCLATE 100 MG PO TABS: 100.0000 mg | ORAL_TABLET | Freq: Two times a day (BID) | ORAL | Status: DC

## 2016-05-22 NOTE — Progress Notes (Signed)
Pre visit review using our clinic review tool, if applicable. No additional management support is needed unless otherwise documented below in the visit note. 

## 2016-05-22 NOTE — Assessment & Plan Note (Signed)
Chronic BP nl at home Spironolactone, Amlodipine

## 2016-05-22 NOTE — Assessment & Plan Note (Signed)
Doxy

## 2016-05-22 NOTE — Assessment & Plan Note (Signed)
Discussed   Cathrine tried to overdose 6 mo ago

## 2016-05-22 NOTE — Progress Notes (Signed)
Subjective:  Patient ID: Vanessa Hardy, female    DOB: [birth year]  Age: 70 y.o. MRN: [id number]  CC: No chief complaint on file.   HPI   EILIS CAROLLO presents for HTN, leg swelling w/travel, Bartholin's gland infection off and on - treated. Cathrine tried to overdose 6 mo ago   Outpatient Prescriptions Prior to Visit  Medication Sig Dispense Refill  . amLODipine (NORVASC) 5 MG tablet Take 1 tablet (5 mg total) by mouth daily. 90 tablet 3  . aspirin 325 MG tablet Take 325 mg by mouth daily.      . ciclopirox (PENLAC) 8 % solution Apply topically at bedtime. Apply over nail and surrounding skin. Apply daily over previous coat. After seven (7) days, may remove with alcohol and continue cycle. 6.6 mL 1  . ketoconazole (NIZORAL) 2 % cream Apply topically as needed. 150 g 3  . spironolactone (ALDACTONE) 25 MG tablet Take 2 tablets (50 mg total) by mouth daily. 180 tablet 3   No facility-administered medications prior to visit.    ROS Review of Systems  Constitutional: Negative for chills, activity change, appetite change, fatigue and unexpected weight change.  HENT: Negative for congestion, mouth sores and sinus pressure.   Eyes: Negative for visual disturbance.  Respiratory: Negative for cough and chest tightness.   Gastrointestinal: Negative for nausea and abdominal pain.  Genitourinary: Negative for frequency, difficulty urinating and vaginal pain.  Musculoskeletal: Negative for back pain and gait problem.  Skin: Negative for pallor and rash.  Neurological: Negative for dizziness, tremors, weakness, numbness and headaches.  Psychiatric/Behavioral: Negative for confusion, sleep disturbance and decreased concentration. The patient is not nervous/anxious.     Objective:  BP 120/80 mmHg  Pulse 81  Wt 172 lb (78.019 kg)  SpO2 97%  BP Readings from Last 3 Encounters:  05/22/16 120/80  11/22/15 120/70  05/21/15 128/82    Wt Readings from Last 3 Encounters:  05/22/16 172 lb  (78.019 kg)  11/22/15 171 lb (77.565 kg)  05/21/15 168 lb 4 oz (76.318 kg)    Physical Exam  Constitutional: She appears well-developed. No distress.  HENT:  Head: Normocephalic.  Right Ear: External ear normal.  Left Ear: External ear normal.  Nose: Nose normal.  Mouth/Throat: Oropharynx is clear and moist.  Eyes: Conjunctivae are normal. Pupils are equal, round, and reactive to light. Right eye exhibits no discharge. Left eye exhibits no discharge.  Neck: Normal range of motion. Neck supple. No JVD present. No tracheal deviation present. No thyromegaly present.  Cardiovascular: Normal rate, regular rhythm and normal heart sounds.   Pulmonary/Chest: No stridor. No respiratory distress. She has no wheezes.  Abdominal: Soft. Bowel sounds are normal. She exhibits no distension and no mass. There is no tenderness. There is no rebound and no guarding.  Musculoskeletal: She exhibits no edema or tenderness.  Lymphadenopathy:    She has no cervical adenopathy.  Neurological: She displays normal reflexes. No cranial nerve deficit. She exhibits normal muscle tone. Coordination normal.  Skin: No rash noted. No erythema.  Psychiatric: She has a normal mood and affect. Her behavior is normal. Judgment and thought content normal.    Lab Results  Component Value Date   WBC 8.7 11/22/2015   HGB 14.0 11/22/2015   HCT 41.5 11/22/2015   PLT 266.0 11/22/2015   GLUCOSE 89 11/22/2015   CHOL 187 11/22/2015   TRIG 62.0 11/22/2015   HDL 46.40 11/22/2015   LDLDIRECT 159.4 09/13/2010   LDLCALC 128* 11/22/2015  ALT 10 11/22/2015   AST 13 11/22/2015   NA 141 11/22/2015   K 4.5 11/22/2015   CL 108 11/22/2015   CREATININE 0.91 11/22/2015   BUN 25* 11/22/2015   CO2 25 11/22/2015   TSH 2.27 11/22/2015    Mm Digital Screening Bilateral  02/01/2016  CLINICAL DATA:  Screening. EXAM: DIGITAL SCREENING BILATERAL MAMMOGRAM WITH CAD COMPARISON:  Previous exam(s). ACR Breast Density Category b: There are  scattered areas of fibroglandular density. FINDINGS: There are no findings suspicious for malignancy. Images were processed with CAD. IMPRESSION: No mammographic evidence of malignancy. A result letter of this screening mammogram will be mailed directly to the patient. RECOMMENDATION: Screening mammogram in one year. (Code:SM-B-01Y) BI-RADS CATEGORY  1: Negative. Electronically Signed   By: Claudie Revering M.D.   On: 02/01/2016 08:36    Assessment & Plan:   There are no diagnoses linked to this encounter. I am having Ms. Gonzales maintain her aspirin, ciclopirox, ketoconazole, amLODipine, and spironolactone.  No orders of the defined types were placed in this encounter.     Follow-up: No Follow-up on file.  Walker Kehr, MD

## 2016-08-25 ENCOUNTER — Ambulatory Visit (INDEPENDENT_AMBULATORY_CARE_PROVIDER_SITE_OTHER)
Admission: RE | Admit: 2016-08-25 | Discharge: 2016-08-25 | Disposition: A | Discharge status: Home / Self Care | Payer: Medicare Other | Source: Ambulatory Visit | Attending: Nurse Practitioner | Admitting: Nurse Practitioner

## 2016-08-25 ENCOUNTER — Encounter: Payer: Self-pay | Admitting: Nurse Practitioner

## 2016-08-25 ENCOUNTER — Ambulatory Visit (INDEPENDENT_AMBULATORY_CARE_PROVIDER_SITE_OTHER): Payer: Medicare Other | Admitting: Nurse Practitioner

## 2016-08-25 VITALS — BP 130/84 | HR 67 | Temp 98.3°F | Ht 66.0 in | Wt 183.5 lb

## 2016-08-25 DIAGNOSIS — R0602 Shortness of breath: Secondary | ICD-10-CM

## 2016-08-25 DIAGNOSIS — R05 Cough: Secondary | ICD-10-CM | POA: Diagnosis not present

## 2016-08-25 DIAGNOSIS — J209 Acute bronchitis, unspecified: Secondary | ICD-10-CM | POA: Diagnosis not present

## 2016-08-25 DIAGNOSIS — R059 Cough, unspecified: Secondary | ICD-10-CM

## 2016-08-25 MED ORDER — ALBUTEROL SULFATE HFA 108 (90 BASE) MCG/ACT IN AERS: 2.0000 | INHALATION_SPRAY | Freq: Four times a day (QID) | RESPIRATORY_TRACT | 0 refills | Status: DC | PRN

## 2016-08-25 MED ORDER — PROMETHAZINE-DM 6.25-15 MG/5ML PO SYRP: 5.0000 mL | ORAL_SOLUTION | Freq: Three times a day (TID) | ORAL | 0 refills | Status: DC | PRN

## 2016-08-25 MED ORDER — PREDNISONE 10 MG (21) PO TBPK: ORAL_TABLET | ORAL | 0 refills | Status: DC

## 2016-08-25 NOTE — Progress Notes (Signed)
Pre visit review using our clinic review tool, if applicable. No additional management support is needed unless otherwise documented below in the visit note. 

## 2016-08-25 NOTE — Progress Notes (Signed)
Subjective:  Patient ID: Vanessa Hardy, female    DOB: 1946/12/04  Age: 70 y.o. MRN: [id number]  CC: Sinusitis (Started at the end of July with fever, cough, and sore throat. Pt does not have much of a sore throat anymore. Pt does have a dry cough that is not productive but is much worse in the morning. )   Vanessa Hardy presents for cough  Cough  This is a new problem. The current episode started more than 1 month ago. The problem has been waxing and waning. The problem occurs constantly. The cough is productive of sputum. Associated symptoms include postnasal drip and shortness of breath. Pertinent negatives include no chest pain, chills, ear congestion, ear pain, fever, headaches, heartburn, hemoptysis, myalgias, nasal congestion, rash, rhinorrhea, sore throat, sweats, weight loss or wheezing. The symptoms are aggravated by lying down (excessive talking). She has tried nothing for the symptoms. Her past medical history is significant for environmental allergies.    Outpatient Medications Prior to Visit  Medication Sig Dispense Refill  . amLODipine (NORVASC) 5 MG tablet Take 1 tablet (5 mg total) by mouth daily. 90 tablet 3  . aspirin 325 MG tablet Take 325 mg by mouth daily.      . ciclopirox (PENLAC) 8 % solution Apply topically at bedtime. Apply over nail and surrounding skin. Apply daily over previous coat. After seven (7) days, may remove with alcohol and continue cycle. 6.6 mL 1  . ketoconazole (NIZORAL) 2 % cream Apply topically as needed. 60 g 3  . spironolactone (ALDACTONE) 25 MG tablet Take 2 tablets (50 mg total) by mouth daily. 180 tablet 3  . doxycycline (VIBRA-TABS) 100 MG tablet Take 1 tablet (100 mg total) by mouth 2 (two) times daily. 20 tablet 1   No facility-administered medications prior to visit.     ROS See HPI  Objective:  BP 130/84 (BP Location: Left Arm, Patient Position: Sitting, Cuff Size: Normal)   Pulse 67   Temp 98.3 F (36.8 C) (Oral)   Ht 5\' 6"   (1.676 m)   Wt 183 lb 8 oz (83.2 kg)   SpO2 96%   BMI 29.62 kg/m   BP Readings from Last 3 Encounters:  08/25/16 130/84  05/22/16 120/80  11/22/15 120/70    Wt Readings from Last 3 Encounters:  08/25/16 183 lb 8 oz (83.2 kg)  05/22/16 172 lb (78 kg)  11/22/15 171 lb (77.6 kg)    Physical Exam  Constitutional: She is oriented to person, place, and time. No distress.  HENT:  Mouth/Throat: No oropharyngeal exudate.  Eyes: No scleral icterus.  Neck: Normal range of motion. Neck supple. No thyromegaly present.  Cardiovascular: Normal rate and normal heart sounds.   Pulmonary/Chest: Effort normal and breath sounds normal.  Musculoskeletal: Normal range of motion. She exhibits no edema.  Lymphadenopathy:    She has no cervical adenopathy.  Neurological: She is alert and oriented to person, place, and time.  Skin: Skin is warm and dry.  Psychiatric: She has a normal mood and affect. Her behavior is normal.  Vitals reviewed.   Lab Results  Component Value Date   WBC 8.7 11/22/2015   HGB 14.0 11/22/2015   HCT 41.5 11/22/2015   PLT 266.0 11/22/2015   GLUCOSE 96 05/22/2016   CHOL 197 05/22/2016   TRIG 90.0 05/22/2016   HDL 46.90 05/22/2016   LDLDIRECT 159.4 09/13/2010   LDLCALC 132 (H) 05/22/2016   ALT 13 05/22/2016   AST 14 05/22/2016  NA 140 05/22/2016   K 4.6 05/22/2016   CL 108 05/22/2016   CREATININE 0.88 05/22/2016   BUN 28 (H) 05/22/2016   CO2 26 05/22/2016   TSH 2.27 11/22/2015   Chest x-ray negative for pneumonia. . Assessment & Plan:   Philis was seen today for sinusitis.  Diagnoses and all orders for this visit:  Acute bronchitis, unspecified organism -     promethazine-dextromethorphan (PROMETHAZINE-DM) 6.25-15 MG/5ML syrup; Take 5 mLs by mouth 3 (three) times daily as needed for cough. -     DG Chest 2 View; Future -     albuterol (PROVENTIL HFA;VENTOLIN HFA) 108 (90 Base) MCG/ACT inhaler; Inhale 2 puffs into the lungs every 6 (six) hours as  needed for wheezing or shortness of breath. -     predniSONE (STERAPRED UNI-PAK 21 TAB) 10 MG (21) TBPK tablet; Follow instructions on package.  SOB (shortness of breath) -     promethazine-dextromethorphan (PROMETHAZINE-DM) 6.25-15 MG/5ML syrup; Take 5 mLs by mouth 3 (three) times daily as needed for cough. -     DG Chest 2 View; Future -     albuterol (PROVENTIL HFA;VENTOLIN HFA) 108 (90 Base) MCG/ACT inhaler; Inhale 2 puffs into the lungs every 6 (six) hours as needed for wheezing or shortness of breath.   I have discontinued Ms. Pontarelli's doxycycline. I am also having her start on promethazine-dextromethorphan, albuterol, and predniSONE. Additionally, I am having her maintain her aspirin, ciclopirox, amLODipine, spironolactone, and ketoconazole.  Meds ordered this encounter  Medications  . promethazine-dextromethorphan (PROMETHAZINE-DM) 6.25-15 MG/5ML syrup    Sig: Take 5 mLs by mouth 3 (three) times daily as needed for cough.    Dispense:  240 mL    Refill:  0    Order Specific Question:   Supervising Provider    Answer:   Cassandria Anger [[id number]]  . albuterol (PROVENTIL HFA;VENTOLIN HFA) 108 (90 Base) MCG/ACT inhaler    Sig: Inhale 2 puffs into the lungs every 6 (six) hours as needed for wheezing or shortness of breath.    Dispense:  1 Inhaler    Refill:  0    Order Specific Question:   Supervising Provider    Answer:   Cassandria Anger [[id number]]  . predniSONE (STERAPRED UNI-PAK 21 TAB) 10 MG (21) TBPK tablet    Sig: Follow instructions on package.    Dispense:  21 tablet    Refill:  0    Order Specific Question:   Supervising Provider    Answer:   Cassandria Anger [[id number]]    Follow-up: Return if symptoms worsen or fail to improve.  Wilfred Lacy, NP

## 2016-08-25 NOTE — Patient Instructions (Signed)
Go downstairs for a chest x-ray. Will call with results.

## 2016-10-18 DIAGNOSIS — H40013 Open angle with borderline findings, low risk, bilateral: Secondary | ICD-10-CM | POA: Diagnosis not present

## 2016-10-18 DIAGNOSIS — H35371 Puckering of macula, right eye: Secondary | ICD-10-CM | POA: Diagnosis not present

## 2016-10-18 DIAGNOSIS — Z961 Presence of intraocular lens: Secondary | ICD-10-CM | POA: Diagnosis not present

## 2016-10-24 DIAGNOSIS — D225 Melanocytic nevi of trunk: Secondary | ICD-10-CM | POA: Diagnosis not present

## 2016-10-24 DIAGNOSIS — L570 Actinic keratosis: Secondary | ICD-10-CM | POA: Diagnosis not present

## 2016-10-24 DIAGNOSIS — D2262 Melanocytic nevi of left upper limb, including shoulder: Secondary | ICD-10-CM | POA: Diagnosis not present

## 2016-10-24 DIAGNOSIS — D2261 Melanocytic nevi of right upper limb, including shoulder: Secondary | ICD-10-CM | POA: Diagnosis not present

## 2016-10-26 ENCOUNTER — Encounter: Payer: Self-pay | Admitting: Internal Medicine

## 2016-10-26 ENCOUNTER — Ambulatory Visit (INDEPENDENT_AMBULATORY_CARE_PROVIDER_SITE_OTHER): Payer: Medicare Other | Admitting: Internal Medicine

## 2016-10-26 VITALS — BP 118/70 | HR 72 | Temp 98.0°F | Wt 184.0 lb

## 2016-10-26 DIAGNOSIS — R05 Cough: Secondary | ICD-10-CM | POA: Insufficient documentation

## 2016-10-26 DIAGNOSIS — Z23 Encounter for immunization: Secondary | ICD-10-CM | POA: Diagnosis not present

## 2016-10-26 DIAGNOSIS — R938 Abnormal findings on diagnostic imaging of other specified body structures: Secondary | ICD-10-CM | POA: Diagnosis not present

## 2016-10-26 DIAGNOSIS — R9389 Abnormal findings on diagnostic imaging of other specified body structures: Secondary | ICD-10-CM

## 2016-10-26 DIAGNOSIS — R059 Cough, unspecified: Secondary | ICD-10-CM

## 2016-10-26 MED ORDER — FLUTICASONE FUROATE-VILANTEROL 100-25 MCG/INH IN AEPB: 1.0000 | INHALATION_SPRAY | Freq: Every day | RESPIRATORY_TRACT | 5 refills | Status: DC

## 2016-10-26 MED ORDER — DOXYCYCLINE HYCLATE 100 MG PO TABS: 100.0000 mg | ORAL_TABLET | Freq: Two times a day (BID) | ORAL | 0 refills | Status: DC

## 2016-10-26 MED ORDER — PROMETHAZINE-CODEINE 6.25-10 MG/5ML PO SYRP: 5.0000 mL | ORAL_SOLUTION | ORAL | 0 refills | Status: DC | PRN

## 2016-10-26 NOTE — Assessment & Plan Note (Signed)
7-11/17 asthmatic

## 2016-10-26 NOTE — Assessment & Plan Note (Addendum)
B bases w/scarring CT w/o contrast

## 2016-10-26 NOTE — Progress Notes (Signed)
Pre visit review using our clinic review tool, if applicable. No additional management support is needed unless otherwise documented below in the visit note. 

## 2016-10-26 NOTE — Progress Notes (Signed)
Subjective:  Patient ID: Vanessa Hardy, female    DOB: [birth year]  Age: 70 y.o. MRN: [id number]  CC: No chief complaint on file.   HPI HERMIA CLOHESSY presents for a cough since July; productive cough, abn CXR   Outpatient Medications Prior to Visit  Medication Sig Dispense Refill  . albuterol (PROVENTIL HFA;VENTOLIN HFA) 108 (90 Base) MCG/ACT inhaler Inhale 2 puffs into the lungs every 6 (six) hours as needed for wheezing or shortness of breath. 1 Inhaler 0  . amLODipine (NORVASC) 5 MG tablet Take 1 tablet (5 mg total) by mouth daily. 90 tablet 3  . aspirin 325 MG tablet Take 325 mg by mouth daily.      . ciclopirox (PENLAC) 8 % solution Apply topically at bedtime. Apply over nail and surrounding skin. Apply daily over previous coat. After seven (7) days, may remove with alcohol and continue cycle. 6.6 mL 1  . ketoconazole (NIZORAL) 2 % cream Apply topically as needed. 60 g 3  . spironolactone (ALDACTONE) 25 MG tablet Take 2 tablets (50 mg total) by mouth daily. 180 tablet 3  . promethazine-dextromethorphan (PROMETHAZINE-DM) 6.25-15 MG/5ML syrup Take 5 mLs by mouth 3 (three) times daily as needed for cough. (Patient not taking: Reported on 10/26/2016) 240 mL 0  . predniSONE (STERAPRED UNI-PAK 21 TAB) 10 MG (21) TBPK tablet Follow instructions on package. (Patient not taking: Reported on 10/26/2016) 21 tablet 0   No facility-administered medications prior to visit.     ROS Review of Systems  Constitutional: Negative for activity change, appetite change, chills, fatigue and unexpected weight change.  HENT: Negative for congestion, mouth sores and sinus pressure.   Eyes: Negative for visual disturbance.  Respiratory: Positive for cough. Negative for chest tightness and wheezing.   Gastrointestinal: Negative for abdominal pain and nausea.  Genitourinary: Negative for difficulty urinating, frequency and vaginal pain.  Musculoskeletal: Negative for back pain and gait problem.  Skin:  Negative for pallor and rash.  Neurological: Negative for dizziness, tremors, weakness, numbness and headaches.  Psychiatric/Behavioral: Negative for confusion and sleep disturbance.    Objective:  BP 118/70   Pulse 72   Temp 98 F (36.7 C) (Oral)   Wt 184 lb (83.5 kg)   SpO2 94%   BMI 29.70 kg/m   BP Readings from Last 3 Encounters:  10/26/16 118/70  08/25/16 130/84  05/22/16 120/80    Wt Readings from Last 3 Encounters:  10/26/16 184 lb (83.5 kg)  08/25/16 183 lb 8 oz (83.2 kg)  05/22/16 172 lb (78 kg)    Physical Exam  Constitutional: She appears well-developed. No distress.  HENT:  Head: Normocephalic.  Right Ear: External ear normal.  Left Ear: External ear normal.  Nose: Nose normal.  Mouth/Throat: Oropharynx is clear and moist.  Eyes: Conjunctivae are normal. Pupils are equal, round, and reactive to light. Right eye exhibits no discharge. Left eye exhibits no discharge.  Neck: Normal range of motion. Neck supple. No JVD present. No tracheal deviation present. No thyromegaly present.  Cardiovascular: Normal rate, regular rhythm and normal heart sounds.   Pulmonary/Chest: No stridor. No respiratory distress. She has no wheezes.  Abdominal: Soft. Bowel sounds are normal. She exhibits no distension and no mass. There is no tenderness. There is no rebound and no guarding.  Musculoskeletal: She exhibits no edema or tenderness.  Lymphadenopathy:    She has no cervical adenopathy.  Neurological: She displays normal reflexes. No cranial nerve deficit. She exhibits normal muscle tone. Coordination  normal.  Skin: No rash noted. No erythema.  Psychiatric: She has a normal mood and affect. Her behavior is normal. Judgment and thought content normal.    Lab Results  Component Value Date   WBC 8.7 11/22/2015   HGB 14.0 11/22/2015   HCT 41.5 11/22/2015   PLT 266.0 11/22/2015   GLUCOSE 96 05/22/2016   CHOL 197 05/22/2016   TRIG 90.0 05/22/2016   HDL 46.90 05/22/2016    LDLDIRECT 159.4 09/13/2010   LDLCALC 132 (H) 05/22/2016   ALT 13 05/22/2016   AST 14 05/22/2016   NA 140 05/22/2016   K 4.6 05/22/2016   CL 108 05/22/2016   CREATININE 0.88 05/22/2016   BUN 28 (H) 05/22/2016   CO2 26 05/22/2016   TSH 2.27 11/22/2015    Dg Chest 2 View  Result Date: 08/25/2016 CLINICAL DATA:  Nonproductive cough and shortness of breath for 2 months. History of pneumonia and hypertension EXAM: CHEST  2 VIEW COMPARISON:  01/14/2008 FINDINGS: There is linear scarring present within both lung bases. There is no acute pulmonary infiltrate, consolidation or effusion. Cardiomediastinal silhouette is stable. There is no pneumothorax. There are mild degenerative osteophytes of the spine. IMPRESSION: 1. Mild bibasilar scarring or fibrosis. 2. No acute consolidation or acute infiltrate identified. Electronically Signed   By: Donavan Foil M.D.   On: 08/25/2016 14:21    Assessment & Plan:   There are no diagnoses linked to this encounter. I have discontinued Ms. Winward's predniSONE. I am also having her maintain her aspirin, ciclopirox, amLODipine, spironolactone, ketoconazole, promethazine-dextromethorphan, and albuterol.  No orders of the defined types were placed in this encounter.    Follow-up: No Follow-up on file.  Walker Kehr, MD

## 2016-10-31 ENCOUNTER — Ambulatory Visit (INDEPENDENT_AMBULATORY_CARE_PROVIDER_SITE_OTHER)
Admission: RE | Admit: 2016-10-31 | Discharge: 2016-10-31 | Disposition: A | Discharge status: Home / Self Care | Payer: Medicare Other | Source: Ambulatory Visit | Attending: Internal Medicine | Admitting: Internal Medicine

## 2016-10-31 DIAGNOSIS — R918 Other nonspecific abnormal finding of lung field: Secondary | ICD-10-CM | POA: Diagnosis not present

## 2016-10-31 DIAGNOSIS — R05 Cough: Secondary | ICD-10-CM

## 2016-10-31 DIAGNOSIS — R059 Cough, unspecified: Secondary | ICD-10-CM

## 2016-10-31 DIAGNOSIS — R938 Abnormal findings on diagnostic imaging of other specified body structures: Secondary | ICD-10-CM

## 2016-10-31 DIAGNOSIS — R9389 Abnormal findings on diagnostic imaging of other specified body structures: Secondary | ICD-10-CM

## 2016-11-20 ENCOUNTER — Other Ambulatory Visit (INDEPENDENT_AMBULATORY_CARE_PROVIDER_SITE_OTHER): Payer: Medicare Other

## 2016-11-20 ENCOUNTER — Encounter: Payer: Self-pay | Admitting: Internal Medicine

## 2016-11-20 ENCOUNTER — Ambulatory Visit (INDEPENDENT_AMBULATORY_CARE_PROVIDER_SITE_OTHER): Payer: Medicare Other | Admitting: Internal Medicine

## 2016-11-20 VITALS — BP 100/68 | HR 72 | Temp 97.8°F | Wt 178.0 lb

## 2016-11-20 DIAGNOSIS — R938 Abnormal findings on diagnostic imaging of other specified body structures: Secondary | ICD-10-CM | POA: Diagnosis not present

## 2016-11-20 DIAGNOSIS — I10 Essential (primary) hypertension: Secondary | ICD-10-CM | POA: Diagnosis not present

## 2016-11-20 DIAGNOSIS — R002 Palpitations: Secondary | ICD-10-CM

## 2016-11-20 DIAGNOSIS — R9389 Abnormal findings on diagnostic imaging of other specified body structures: Secondary | ICD-10-CM

## 2016-11-20 DIAGNOSIS — I712 Thoracic aortic aneurysm, without rupture, unspecified: Secondary | ICD-10-CM | POA: Insufficient documentation

## 2016-11-20 DIAGNOSIS — R202 Paresthesia of skin: Secondary | ICD-10-CM

## 2016-11-20 DIAGNOSIS — R5383 Other fatigue: Secondary | ICD-10-CM | POA: Diagnosis not present

## 2016-11-20 DIAGNOSIS — E538 Deficiency of other specified B group vitamins: Secondary | ICD-10-CM | POA: Insufficient documentation

## 2016-11-20 LAB — BASIC METABOLIC PANEL
BUN: 24 mg/dL — AB (ref 6–23)
CO2: 25 mEq/L (ref 19–32)
CREATININE: 0.94 mg/dL (ref 0.40–1.20)
Calcium: 9.5 mg/dL (ref 8.4–10.5)
Chloride: 106 mEq/L (ref 96–112)
GFR: 62.65 mL/min (ref >60.00)
GLUCOSE: 101 mg/dL — AB (ref 70–99)
POTASSIUM: 4.3 meq/L (ref 3.5–5.1)
Sodium: 139 mEq/L (ref 135–145)

## 2016-11-20 LAB — HEPATIC FUNCTION PANEL
ALBUMIN: 4.2 g/dL (ref 3.5–5.2)
ALK PHOS: 46 U/L (ref 39–117)
ALT: 13 U/L (ref 0–35)
AST: 14 U/L (ref 0–37)
Bilirubin, Direct: 0.1 mg/dL (ref 0.0–0.3)
TOTAL PROTEIN: 7.3 g/dL (ref 6.0–8.3)
Total Bilirubin: 0.7 mg/dL (ref 0.2–1.2)

## 2016-11-20 LAB — CBC WITH DIFFERENTIAL/PLATELET
BASOS PCT: 0.5 % (ref 0.0–3.0)
Basophils Absolute: 0 10*3/uL (ref 0.0–0.1)
Eosinophils Absolute: 0.1 10*3/uL (ref 0.0–0.7)
Eosinophils Relative: 1.7 % (ref 0.0–5.0)
HEMATOCRIT: 41.2 % (ref 36.0–46.0)
HEMOGLOBIN: 14 g/dL (ref 12.0–15.0)
LYMPHS PCT: 21.2 % (ref 12.0–46.0)
Lymphs Abs: 1.6 10*3/uL (ref 0.7–4.0)
MCHC: 33.9 g/dL (ref 30.0–36.0)
MCV: 92.5 fl (ref 78.0–100.0)
MONOS PCT: 6.3 % (ref 3.0–12.0)
Monocytes Absolute: 0.5 10*3/uL (ref 0.1–1.0)
NEUTROS ABS: 5.4 10*3/uL (ref 1.4–7.7)
Neutrophils Relative %: 70.3 % (ref 43.0–77.0)
PLATELETS: 248 10*3/uL (ref 150.0–400.0)
RBC: 4.45 Mil/uL (ref 3.87–5.11)
RDW: 13.4 % (ref 11.5–15.5)
WBC: 7.7 10*3/uL (ref 4.0–10.5)

## 2016-11-20 LAB — URINALYSIS, ROUTINE W REFLEX MICROSCOPIC
Bilirubin Urine: NEGATIVE
Hgb urine dipstick: NEGATIVE
Ketones, ur: NEGATIVE
Nitrite: NEGATIVE
PH: 5.5 (ref 5.0–8.0)
RBC / HPF: NONE SEEN (ref 0–?)
TOTAL PROTEIN, URINE-UPE24: NEGATIVE
URINE GLUCOSE: NEGATIVE
UROBILINOGEN UA: 0.2 (ref 0.0–1.0)

## 2016-11-20 LAB — VITAMIN B12: VITAMIN B 12: 157 pg/mL — AB (ref 211–911)

## 2016-11-20 LAB — TSH: TSH: 2.05 u[IU]/mL (ref 0.35–4.50)

## 2016-11-20 MED ORDER — PRAVASTATIN SODIUM 20 MG PO TABS: 20.0000 mg | ORAL_TABLET | Freq: Every day | ORAL | 11 refills | Status: DC

## 2016-11-20 MED ORDER — B-12 5000 MCG SL SUBL: SUBLINGUAL_TABLET | SUBLINGUAL | 3 refills | Status: DC

## 2016-11-20 NOTE — Assessment & Plan Note (Signed)
11/17 ascending thoracic aorta 4.1 cm greatest diameter on CT Card ref

## 2016-11-20 NOTE — Assessment & Plan Note (Addendum)
Labs, Gold test Cardiology ref

## 2016-11-20 NOTE — Assessment & Plan Note (Addendum)
Chest CT in 3-6 mo Gold test

## 2016-11-20 NOTE — Progress Notes (Signed)
Pre visit review using our clinic review tool, if applicable. No additional management support is needed unless otherwise documented below in the visit note. 

## 2016-11-20 NOTE — Assessment & Plan Note (Signed)
Spironolactone, Amlodipine

## 2016-11-20 NOTE — Progress Notes (Signed)
Subjective:  Patient ID: Vanessa Hardy, female    DOB: 1946/08/20  Age: 70 y.o. MRN: [id number]  CC: No chief complaint on file.   HPI AERILYNN GABB presents for fatigue - not feeling well x 2 weeks (very tired). Cough (since July) is 80 % better  Outpatient Medications Prior to Visit  Medication Sig Dispense Refill  . amLODipine (NORVASC) 5 MG tablet Take 1 tablet (5 mg total) by mouth daily. 90 tablet 3  . aspirin 325 MG tablet Take 325 mg by mouth daily.      . ciclopirox (PENLAC) 8 % solution Apply topically at bedtime. Apply over nail and surrounding skin. Apply daily over previous coat. After seven (7) days, may remove with alcohol and continue cycle. 6.6 mL 1  . doxycycline (VIBRA-TABS) 100 MG tablet Take 1 tablet (100 mg total) by mouth 2 (two) times daily. 20 tablet 0  . fluticasone furoate-vilanterol (BREO ELLIPTA) 100-25 MCG/INH AEPB Inhale 1 puff into the lungs daily. 1 each 5  . ketoconazole (NIZORAL) 2 % cream Apply topically as needed. 60 g 3  . promethazine-codeine (PHENERGAN WITH CODEINE) 6.25-10 MG/5ML syrup Take 5 mLs by mouth every 4 (four) hours as needed. 300 mL 0  . spironolactone (ALDACTONE) 25 MG tablet Take 2 tablets (50 mg total) by mouth daily. 180 tablet 3   No facility-administered medications prior to visit.     ROS Review of Systems  Constitutional: Positive for fatigue. Negative for activity change, appetite change, chills and unexpected weight change.  HENT: Negative for congestion, mouth sores and sinus pressure.   Eyes: Negative for visual disturbance.  Respiratory: Negative for cough and chest tightness.   Cardiovascular: Positive for palpitations. Negative for chest pain.  Gastrointestinal: Negative for abdominal pain and nausea.  Genitourinary: Negative for difficulty urinating, frequency and vaginal pain.  Musculoskeletal: Negative for back pain and gait problem.  Skin: Negative for pallor and rash.  Neurological: Negative for dizziness,  tremors, weakness, numbness and headaches.  Psychiatric/Behavioral: Negative for confusion and sleep disturbance.    Objective:  BP 100/68   Pulse 72   Temp 97.8 F (36.6 C) (Oral)   Wt 178 lb (80.7 kg)   SpO2 96%   BMI 28.73 kg/m   BP Readings from Last 3 Encounters:  11/20/16 100/68  10/26/16 118/70  08/25/16 130/84    Wt Readings from Last 3 Encounters:  11/20/16 178 lb (80.7 kg)  10/26/16 184 lb (83.5 kg)  08/25/16 183 lb 8 oz (83.2 kg)    Physical Exam  Constitutional: She appears well-developed. No distress.  HENT:  Head: Normocephalic.  Right Ear: External ear normal.  Left Ear: External ear normal.  Nose: Nose normal.  Mouth/Throat: Oropharynx is clear and moist.  Eyes: Conjunctivae are normal. Pupils are equal, round, and reactive to light. Right eye exhibits no discharge. Left eye exhibits no discharge.  Neck: Normal range of motion. Neck supple. No JVD present. No tracheal deviation present. No thyromegaly present.  Cardiovascular: Normal rate, regular rhythm and normal heart sounds.   Pulmonary/Chest: No stridor. No respiratory distress. She has no wheezes.  Abdominal: Soft. Bowel sounds are normal. She exhibits no distension and no mass. There is no tenderness. There is no rebound and no guarding.  Musculoskeletal: She exhibits no edema or tenderness.  Lymphadenopathy:    She has no cervical adenopathy.  Neurological: She displays normal reflexes. No cranial nerve deficit. She exhibits normal muscle tone. Coordination normal.  Skin: No rash noted.  No erythema.  Psychiatric: She has a normal mood and affect. Her behavior is normal. Judgment and thought content normal.    Procedure: EKG Indication: AA, thoracic Impression: NSR. No acute changes.   Lab Results  Component Value Date   WBC 8.7 11/22/2015   HGB 14.0 11/22/2015   HCT 41.5 11/22/2015   PLT 266.0 11/22/2015   GLUCOSE 96 05/22/2016   CHOL 197 05/22/2016   TRIG 90.0 05/22/2016   HDL  46.90 05/22/2016   LDLDIRECT 159.4 09/13/2010   LDLCALC 132 (H) 05/22/2016   ALT 13 05/22/2016   AST 14 05/22/2016   NA 140 05/22/2016   K 4.6 05/22/2016   CL 108 05/22/2016   CREATININE 0.88 05/22/2016   BUN 28 (H) 05/22/2016   CO2 26 05/22/2016   TSH 2.27 11/22/2015    Ct Chest Wo Contrast  Result Date: 10/31/2016 CLINICAL DATA:  Recent cough, abnormal chest radiograph EXAM: CT CHEST WITHOUT CONTRAST TECHNIQUE: Multidetector CT imaging of the chest was performed following the standard protocol without IV contrast. Sagittal and coronal MPR images reconstructed from axial data set. COMPARISON:  None; correlation chest radiograph 08/25/2016 FINDINGS: Cardiovascular: Mild atherosclerotic calcification aorta. Minimal aneurysmal dilatation of the ascending thoracic aorta 4.1 cm transverse image 68. No pericardial effusion. Mediastinum/Nodes: Question tiny hiatal hernia. Remainder of esophagus unremarkable. Base of cervical region unremarkable. Few normal size mediastinal nodes without thoracic adenopathy. Lungs/Pleura: Linear subsegmental atelectasis versus scarring LEFT lower lobe. Multiple BILATERAL pulmonary nodules, not definitely calcified, largest 6 mm diameter RIGHT lower lobe. No pulmonary infiltrate, pleural effusion or pneumothorax. Upper Abdomen: Unremarkable Musculoskeletal: Unremarkable IMPRESSION: Question tiny hiatal hernia. Multiple BILATERAL pulmonary nodules up to 6 mm diameter RIGHT lower lobe, recommendation below. Non-contrast chest CT at 3-6 months is recommended. If the nodules are stable at time of repeat CT, then future CT at 18-24 months (from today's scan) is considered optional for low-risk patients, but is recommended for high-risk patients. This recommendation follows the consensus statement: Guidelines for Management of Incidental Pulmonary Nodules Detected on CT Images: From the Fleischner Society 2017; Radiology 2017; 284:228-243. Aortic atherosclerosis with mild  aneurysmal dilatation of the ascending thoracic aorta 4.1 cm greatest diameter, recommendation below. Recommend annual imaging followup by CTA or MRA. This recommendation follows 2010 ACCF/AHA/AATS/ACR/ASA/SCA/SCAI/SIR/STS/SVM Guidelines for the Diagnosis and Management of Patients with Thoracic Aortic Disease. Circulation. 2010; [id number]:[id number] Electronically Signed   By: Lavonia Dana M.D.   On: 10/31/2016 14:52    Assessment & Plan:   There are no diagnoses linked to this encounter. I am having Ms. Pries maintain her aspirin, ciclopirox, amLODipine, spironolactone, ketoconazole, doxycycline, promethazine-codeine, and fluticasone furoate-vilanterol.  No orders of the defined types were placed in this encounter.    Follow-up: No Follow-up on file.  Walker Kehr, MD

## 2016-11-20 NOTE — Addendum Note (Signed)
Addended by: Cassandria Anger on: 11/20/2016 10:42 PM   Modules accepted: Orders

## 2016-11-20 NOTE — Assessment & Plan Note (Signed)
Start Vit B12 

## 2016-11-21 ENCOUNTER — Telehealth: Payer: Self-pay | Admitting: Internal Medicine

## 2016-11-21 ENCOUNTER — Ambulatory Visit: Payer: Medicare Other | Admitting: Internal Medicine

## 2016-11-21 ENCOUNTER — Encounter: Payer: Self-pay | Admitting: Internal Medicine

## 2016-11-21 DIAGNOSIS — R9389 Abnormal findings on diagnostic imaging of other specified body structures: Secondary | ICD-10-CM

## 2016-11-21 DIAGNOSIS — I712 Thoracic aortic aneurysm, without rupture, unspecified: Secondary | ICD-10-CM

## 2016-11-21 NOTE — Telephone Encounter (Signed)
Patient has a ct chest scheduled for 02/19/2017. They need a BMET entered for patient for 1 week prior.   Please enter BMET w/ no expected date. I will advise patient

## 2016-11-21 NOTE — Telephone Encounter (Signed)
BMET order placed. 

## 2016-11-23 LAB — QUANTIFERON TB GOLD ASSAY (BLOOD)
Interferon Gamma Release Assay: NEGATIVE
MITOGEN-NIL SO: 7.19 [IU]/mL
QUANTIFERON NIL VALUE: 0.02 [IU]/mL
Quantiferon Tb Ag Minus Nil Value: 0 IU/mL

## 2016-11-24 DIAGNOSIS — I10 Essential (primary) hypertension: Secondary | ICD-10-CM | POA: Diagnosis not present

## 2016-11-24 DIAGNOSIS — I712 Thoracic aortic aneurysm, without rupture: Secondary | ICD-10-CM | POA: Diagnosis not present

## 2016-11-30 ENCOUNTER — Telehealth: Payer: Self-pay | Admitting: Internal Medicine

## 2016-11-30 NOTE — Telephone Encounter (Signed)
Dr Alain Marion, Per Kandra Nicolas Patient (pt called and wanted to CX CT she will get care from another facility office aware)

## 2016-11-30 NOTE — Telephone Encounter (Signed)
Noted. Thx.

## 2016-12-01 ENCOUNTER — Ambulatory Visit (INDEPENDENT_AMBULATORY_CARE_PROVIDER_SITE_OTHER): Payer: Medicare Other | Admitting: Geriatric Medicine

## 2016-12-01 DIAGNOSIS — E538 Deficiency of other specified B group vitamins: Secondary | ICD-10-CM

## 2016-12-01 MED ORDER — CYANOCOBALAMIN 1000 MCG/ML IJ SOLN
1000.0000 ug | Freq: Once | INTRAMUSCULAR | Status: AC
  Administered 2016-12-01: 1000 ug via INTRAMUSCULAR

## 2016-12-07 ENCOUNTER — Encounter: Payer: Self-pay | Admitting: Internal Medicine

## 2016-12-19 ENCOUNTER — Encounter: Payer: Self-pay | Admitting: Internal Medicine

## 2016-12-19 ENCOUNTER — Telehealth: Payer: Self-pay | Admitting: Internal Medicine

## 2016-12-19 ENCOUNTER — Ambulatory Visit (INDEPENDENT_AMBULATORY_CARE_PROVIDER_SITE_OTHER): Payer: Medicare Other | Admitting: Internal Medicine

## 2016-12-19 ENCOUNTER — Ambulatory Visit: Payer: Medicare Other | Admitting: Internal Medicine

## 2016-12-19 ENCOUNTER — Other Ambulatory Visit: Payer: Self-pay | Admitting: Internal Medicine

## 2016-12-19 ENCOUNTER — Other Ambulatory Visit (INDEPENDENT_AMBULATORY_CARE_PROVIDER_SITE_OTHER): Payer: Medicare Other

## 2016-12-19 ENCOUNTER — Ambulatory Visit (INDEPENDENT_AMBULATORY_CARE_PROVIDER_SITE_OTHER)
Admission: RE | Admit: 2016-12-19 | Discharge: 2016-12-19 | Disposition: A | Discharge status: Home / Self Care | Payer: Medicare Other | Source: Ambulatory Visit | Attending: Internal Medicine | Admitting: Internal Medicine

## 2016-12-19 VITALS — BP 104/80 | HR 84 | Temp 97.5°F | Wt 171.0 lb

## 2016-12-19 DIAGNOSIS — E538 Deficiency of other specified B group vitamins: Secondary | ICD-10-CM

## 2016-12-19 DIAGNOSIS — I712 Thoracic aortic aneurysm, without rupture, unspecified: Secondary | ICD-10-CM

## 2016-12-19 DIAGNOSIS — R0602 Shortness of breath: Secondary | ICD-10-CM

## 2016-12-19 DIAGNOSIS — R05 Cough: Secondary | ICD-10-CM

## 2016-12-19 DIAGNOSIS — R938 Abnormal findings on diagnostic imaging of other specified body structures: Secondary | ICD-10-CM | POA: Diagnosis not present

## 2016-12-19 DIAGNOSIS — R059 Cough, unspecified: Secondary | ICD-10-CM

## 2016-12-19 DIAGNOSIS — R9389 Abnormal findings on diagnostic imaging of other specified body structures: Secondary | ICD-10-CM

## 2016-12-19 LAB — BASIC METABOLIC PANEL
BUN: 19 mg/dL (ref 6–23)
CHLORIDE: 104 meq/L (ref 96–112)
CO2: 26 meq/L (ref 19–32)
CREATININE: 0.85 mg/dL (ref 0.40–1.20)
Calcium: 9.6 mg/dL (ref 8.4–10.5)
GFR: 70.35 mL/min (ref >60.00)
GLUCOSE: 105 mg/dL — AB (ref 70–99)
POTASSIUM: 4.2 meq/L (ref 3.5–5.1)
Sodium: 140 mEq/L (ref 135–145)

## 2016-12-19 LAB — D-DIMER, QUANTITATIVE (NOT AT ARMC): D DIMER QUANT: 0.85 ug{FEU}/mL — AB (ref <0.50)

## 2016-12-19 LAB — CREATININE KINASE MB: CK-MB: 1.5 ng/mL (ref 0.3–4.0)

## 2016-12-19 MED ORDER — CYANOCOBALAMIN 1000 MCG/ML IJ SOLN: INTRAMUSCULAR | 6 refills | Status: AC

## 2016-12-19 MED ORDER — METHYLPREDNISOLONE ACETATE 80 MG/ML IJ SUSP
80.0000 mg | Freq: Once | INTRAMUSCULAR | Status: AC
  Administered 2016-12-19: 80 mg via INTRAMUSCULAR

## 2016-12-19 MED ORDER — FLUTICASONE FUROATE-VILANTEROL 100-25 MCG/INH IN AEPB: 1.0000 | INHALATION_SPRAY | Freq: Every day | RESPIRATORY_TRACT | 5 refills | Status: AC

## 2016-12-19 MED ORDER — "SYRINGE/NEEDLE (DISP) 25G X 1"" 3 ML MISC": 3 refills | Status: AC

## 2016-12-19 MED ORDER — CYANOCOBALAMIN 1000 MCG/ML IJ SOLN
1000.0000 ug | Freq: Once | INTRAMUSCULAR | Status: AC
  Administered 2016-12-19: 1000 ug via INTRAMUSCULAR

## 2016-12-19 MED ORDER — RIVAROXABAN (XARELTO) VTE STARTER PACK (15 & 20 MG): ORAL_TABLET | ORAL | 0 refills | Status: AC

## 2016-12-19 MED ORDER — LEVOFLOXACIN 500 MG PO TABS: 500.0000 mg | ORAL_TABLET | Freq: Every day | ORAL | 0 refills | Status: AC

## 2016-12-19 MED ORDER — PROMETHAZINE-CODEINE 6.25-10 MG/5ML PO SYRP: 5.0000 mL | ORAL_SOLUTION | ORAL | 0 refills | Status: AC | PRN

## 2016-12-19 NOTE — Assessment & Plan Note (Addendum)
Addressed  CXR

## 2016-12-19 NOTE — Assessment & Plan Note (Signed)
S/p Card eval in [place]

## 2016-12-19 NOTE — Telephone Encounter (Signed)
Spoke w/p re: elevated D dimer. Xarelto samples 15 mg bid start now CT angio tomorrow

## 2016-12-19 NOTE — Assessment & Plan Note (Signed)
CXR, D dimer, CKMB - STAT Depo-medrol IM Re-start Breo Prom-cod syr Levaquin po

## 2016-12-19 NOTE — Progress Notes (Addendum)
Subjective:  Patient ID: Vanessa Hardy, female    DOB: [birth year]  Age: 71 y.o. MRN: [id number]  CC: Shortness of Breath (has seen a cardiologist in [place]. )   Shortness of Breath  This is a new problem. The current episode started 1 to 4 weeks ago. The problem occurs constantly. The problem has been waxing and waning. Associated symptoms include a sore throat and wheezing. Pertinent negatives include no chest pain, fever, headaches, hemoptysis, leg pain, orthopnea, PND, rash or rhinorrhea. She has tried OTC cough suppressants for the symptoms. The treatment provided no relief. There is no history of COPD, DVT or pneumonia.   Vanessa Hardy presents for SOB, cough x 1-2 weeks. Legrand Como was sick w/same. They drove back from [place] x 16 hrs before Christmas - no CP, leg pain; she got sick prior  They are moving to [place] very soon...  Outpatient Medications Prior to Visit  Medication Sig Dispense Refill  . amLODipine (NORVASC) 5 MG tablet Take 1 tablet (5 mg total) by mouth daily. 90 tablet 3  . aspirin 325 MG tablet Take 325 mg by mouth daily.      . ciclopirox (PENLAC) 8 % solution Apply topically at bedtime. Apply over nail and surrounding skin. Apply daily over previous coat. After seven (7) days, may remove with alcohol and continue cycle. 6.6 mL 1  . Cyanocobalamin (B-12) 5000 MCG SUBL 1 sl qd 30 tablet 3  . fluticasone furoate-vilanterol (BREO ELLIPTA) 100-25 MCG/INH AEPB Inhale 1 puff into the lungs daily. 1 each 5  . ketoconazole (NIZORAL) 2 % cream Apply topically as needed. 60 g 3  . spironolactone (ALDACTONE) 25 MG tablet Take 2 tablets (50 mg total) by mouth daily. 180 tablet 3  . pravastatin (PRAVACHOL) 20 MG tablet Take 1 tablet (20 mg total) by mouth daily. (Patient not taking: Reported on 12/19/2016) 90 tablet 11   No facility-administered medications prior to visit.     ROS Review of Systems  Constitutional: Negative for fever.  HENT: Positive for sore throat. Negative  for rhinorrhea.   Respiratory: Positive for shortness of breath and wheezing. Negative for hemoptysis.   Cardiovascular: Negative for chest pain, orthopnea and PND.  Skin: Negative for rash.  Neurological: Negative for headaches.    Objective:  BP 104/80   Pulse 84   Temp 97.5 F (36.4 C)   Wt 171 lb (77.6 kg)   SpO2 99%   BMI 27.60 kg/m   BP Readings from Last 3 Encounters:  12/19/16 104/80  11/20/16 100/68  10/26/16 118/70    Wt Readings from Last 3 Encounters:  12/19/16 171 lb (77.6 kg)  11/20/16 178 lb (80.7 kg)  10/26/16 184 lb (83.5 kg)    Physical Exam  Constitutional: She appears well-developed. No distress.  HENT:  Head: Normocephalic.  Right Ear: External ear normal.  Left Ear: External ear normal.  Nose: Nose normal.  Mouth/Throat: Oropharynx is clear and moist.  Eyes: Conjunctivae are normal. Pupils are equal, round, and reactive to light. Right eye exhibits no discharge. Left eye exhibits no discharge.  Neck: Normal range of motion. Neck supple. No JVD present. No tracheal deviation present. No thyromegaly present.  Cardiovascular: Normal rate, regular rhythm and normal heart sounds.   Pulmonary/Chest: No stridor. No respiratory distress. She has wheezes.  Abdominal: Soft. Bowel sounds are normal. She exhibits no distension and no mass. There is no tenderness. There is no rebound and no guarding.  Musculoskeletal: She exhibits no edema  or tenderness.  Lymphadenopathy:    She has no cervical adenopathy.  Neurological: She displays normal reflexes. No cranial nerve deficit. She exhibits normal muscle tone. Coordination normal.  Skin: No rash noted. No erythema.  Psychiatric: She has a normal mood and affect. Her behavior is normal. Judgment and thought content normal.  Looks tired B rhonchi Calves NT  Lab Results  Component Value Date   WBC 7.7 11/20/2016   HGB 14.0 11/20/2016   HCT 41.2 11/20/2016   PLT 248.0 11/20/2016   GLUCOSE 101 (H)  11/20/2016   CHOL 197 05/22/2016   TRIG 90.0 05/22/2016   HDL 46.90 05/22/2016   LDLDIRECT 159.4 09/13/2010   LDLCALC 132 (H) 05/22/2016   ALT 13 11/20/2016   AST 14 11/20/2016   NA 139 11/20/2016   K 4.3 11/20/2016   CL 106 11/20/2016   CREATININE 0.94 11/20/2016   BUN 24 (H) 11/20/2016   CO2 25 11/20/2016   TSH 2.05 11/20/2016    Ct Chest Wo Contrast  Result Date: 10/31/2016 CLINICAL DATA:  Recent cough, abnormal chest radiograph EXAM: CT CHEST WITHOUT CONTRAST TECHNIQUE: Multidetector CT imaging of the chest was performed following the standard protocol without IV contrast. Sagittal and coronal MPR images reconstructed from axial data set. COMPARISON:  None; correlation chest radiograph 08/25/2016 FINDINGS: Cardiovascular: Mild atherosclerotic calcification aorta. Minimal aneurysmal dilatation of the ascending thoracic aorta 4.1 cm transverse image 68. No pericardial effusion. Mediastinum/Nodes: Question tiny hiatal hernia. Remainder of esophagus unremarkable. Base of cervical region unremarkable. Few normal size mediastinal nodes without thoracic adenopathy. Lungs/Pleura: Linear subsegmental atelectasis versus scarring LEFT lower lobe. Multiple BILATERAL pulmonary nodules, not definitely calcified, largest 6 mm diameter RIGHT lower lobe. No pulmonary infiltrate, pleural effusion or pneumothorax. Upper Abdomen: Unremarkable Musculoskeletal: Unremarkable IMPRESSION: Question tiny hiatal hernia. Multiple BILATERAL pulmonary nodules up to 6 mm diameter RIGHT lower lobe, recommendation below. Non-contrast chest CT at 3-6 months is recommended. If the nodules are stable at time of repeat CT, then future CT at 18-24 months (from today's scan) is considered optional for low-risk patients, but is recommended for high-risk patients. This recommendation follows the consensus statement: Guidelines for Management of Incidental Pulmonary Nodules Detected on CT Images: From the Fleischner Society 2017;  Radiology 2017; 284:228-243. Aortic atherosclerosis with mild aneurysmal dilatation of the ascending thoracic aorta 4.1 cm greatest diameter, recommendation below. Recommend annual imaging followup by CTA or MRA. This recommendation follows 2010 ACCF/AHA/AATS/ACR/ASA/SCA/SCAI/SIR/STS/SVM Guidelines for the Diagnosis and Management of Patients with Thoracic Aortic Disease. Circulation. 2010; [id number]:5694362 Electronically Signed   By: Lavonia Dana M.D.   On: 10/31/2016 14:52    Assessment & Plan:   There are no diagnoses linked to this encounter. I have discontinued Ms. Blackwelder's pravastatin. I am also having her maintain her aspirin, ciclopirox, amLODipine, spironolactone, ketoconazole, fluticasone furoate-vilanterol, and B-12.  No orders of the defined types were placed in this encounter.    Follow-up: No Follow-up on file.  Walker Kehr, MD

## 2016-12-19 NOTE — Assessment & Plan Note (Signed)
Re-start Breo To ER if worse

## 2016-12-20 ENCOUNTER — Ambulatory Visit (INDEPENDENT_AMBULATORY_CARE_PROVIDER_SITE_OTHER)
Admission: RE | Admit: 2016-12-20 | Discharge: 2016-12-20 | Disposition: A | Discharge status: Home / Self Care | Payer: Medicare Other | Source: Ambulatory Visit | Attending: Internal Medicine | Admitting: Internal Medicine

## 2016-12-20 ENCOUNTER — Telehealth: Payer: Self-pay | Admitting: Internal Medicine

## 2016-12-20 DIAGNOSIS — R0602 Shortness of breath: Secondary | ICD-10-CM | POA: Diagnosis not present

## 2016-12-20 MED ORDER — IOPAMIDOL (ISOVUE-370) INJECTION 76%
80.0000 mL | Freq: Once | INTRAVENOUS | Status: AC | PRN
  Administered 2016-12-20: 80 mL via INTRAVENOUS

## 2016-12-20 NOTE — Telephone Encounter (Signed)
Spoke w/pt - feeling better. CT - no PE Pt declined LE Doppler Take Xarelto on the day of the trip to [place]

## 2016-12-25 ENCOUNTER — Telehealth: Payer: Self-pay | Admitting: Internal Medicine

## 2016-12-25 NOTE — Telephone Encounter (Signed)
Pt's spouse - Maybree Groetsch [id number] called in to make provider aware that after of 4 doses of LEVAQUIN pt developed right acelius tendonitis they have stopped medication and put pt on crutches.

## 2016-12-27 NOTE — Telephone Encounter (Signed)
Spoke w/pt - feeling better re URI and heel pain; no need for another abx per Dory  I left a VM for Legrand Como

## 2017-01-04 DIAGNOSIS — M7661 Achilles tendinitis, right leg: Secondary | ICD-10-CM | POA: Diagnosis not present

## 2017-01-04 DIAGNOSIS — Z0189 Encounter for other specified special examinations: Secondary | ICD-10-CM | POA: Diagnosis not present

## 2017-02-13 DIAGNOSIS — E7800 Pure hypercholesterolemia, unspecified: Secondary | ICD-10-CM | POA: Diagnosis not present

## 2017-02-13 DIAGNOSIS — E538 Deficiency of other specified B group vitamins: Secondary | ICD-10-CM | POA: Diagnosis not present

## 2017-02-13 DIAGNOSIS — Z780 Asymptomatic menopausal state: Secondary | ICD-10-CM | POA: Diagnosis not present

## 2017-02-13 DIAGNOSIS — Z8759 Personal history of other complications of pregnancy, childbirth and the puerperium: Secondary | ICD-10-CM | POA: Diagnosis not present

## 2017-02-13 DIAGNOSIS — I10 Essential (primary) hypertension: Secondary | ICD-10-CM | POA: Diagnosis not present

## 2017-02-13 DIAGNOSIS — I712 Thoracic aortic aneurysm, without rupture: Secondary | ICD-10-CM | POA: Diagnosis not present

## 2017-02-13 DIAGNOSIS — M7660 Achilles tendinitis, unspecified leg: Secondary | ICD-10-CM | POA: Diagnosis not present

## 2017-02-13 DIAGNOSIS — Z8659 Personal history of other mental and behavioral disorders: Secondary | ICD-10-CM | POA: Diagnosis not present

## 2017-02-13 DIAGNOSIS — R911 Solitary pulmonary nodule: Secondary | ICD-10-CM | POA: Diagnosis not present

## 2017-02-13 DIAGNOSIS — Z8249 Family history of ischemic heart disease and other diseases of the circulatory system: Secondary | ICD-10-CM | POA: Diagnosis not present

## 2017-02-13 DIAGNOSIS — Z1321 Encounter for screening for nutritional disorder: Secondary | ICD-10-CM | POA: Diagnosis not present

## 2017-02-13 DIAGNOSIS — Z803 Family history of malignant neoplasm of breast: Secondary | ICD-10-CM | POA: Diagnosis not present

## 2017-02-14 DIAGNOSIS — M899 Disorder of bone, unspecified: Secondary | ICD-10-CM | POA: Diagnosis not present

## 2017-02-14 DIAGNOSIS — Z1231 Encounter for screening mammogram for malignant neoplasm of breast: Secondary | ICD-10-CM | POA: Diagnosis not present

## 2017-02-15 DIAGNOSIS — Z1321 Encounter for screening for nutritional disorder: Secondary | ICD-10-CM | POA: Diagnosis not present

## 2017-02-15 DIAGNOSIS — E7800 Pure hypercholesterolemia, unspecified: Secondary | ICD-10-CM | POA: Diagnosis not present

## 2017-02-19 ENCOUNTER — Inpatient Hospital Stay: Admission: RE | Admit: 2017-02-19 | Payer: Medicare Other | Source: Ambulatory Visit

## 2017-02-19 ENCOUNTER — Ambulatory Visit: Payer: Medicare Other | Admitting: Internal Medicine

## 2017-02-28 DIAGNOSIS — E784 Other hyperlipidemia: Secondary | ICD-10-CM | POA: Diagnosis not present

## 2017-02-28 DIAGNOSIS — I10 Essential (primary) hypertension: Secondary | ICD-10-CM | POA: Diagnosis not present

## 2017-02-28 DIAGNOSIS — I712 Thoracic aortic aneurysm, without rupture: Secondary | ICD-10-CM | POA: Diagnosis not present

## 2017-04-02 DIAGNOSIS — R918 Other nonspecific abnormal finding of lung field: Secondary | ICD-10-CM | POA: Diagnosis not present

## 2017-04-09 DIAGNOSIS — E7800 Pure hypercholesterolemia, unspecified: Secondary | ICD-10-CM | POA: Diagnosis not present

## 2017-04-09 DIAGNOSIS — I711 Thoracic aortic aneurysm, ruptured: Secondary | ICD-10-CM | POA: Diagnosis not present

## 2017-04-09 DIAGNOSIS — Z803 Family history of malignant neoplasm of breast: Secondary | ICD-10-CM | POA: Diagnosis not present

## 2017-04-09 DIAGNOSIS — M8580 Other specified disorders of bone density and structure, unspecified site: Secondary | ICD-10-CM | POA: Diagnosis not present

## 2017-04-09 DIAGNOSIS — R5383 Other fatigue: Secondary | ICD-10-CM | POA: Diagnosis not present

## 2017-04-09 DIAGNOSIS — E559 Vitamin D deficiency, unspecified: Secondary | ICD-10-CM | POA: Diagnosis not present

## 2017-04-09 DIAGNOSIS — E538 Deficiency of other specified B group vitamins: Secondary | ICD-10-CM | POA: Diagnosis not present

## 2017-04-09 DIAGNOSIS — I712 Thoracic aortic aneurysm, without rupture: Secondary | ICD-10-CM | POA: Diagnosis not present

## 2017-04-09 DIAGNOSIS — R911 Solitary pulmonary nodule: Secondary | ICD-10-CM | POA: Diagnosis not present

## 2017-04-09 DIAGNOSIS — Z0000 Encounter for general adult medical examination without abnormal findings: Secondary | ICD-10-CM | POA: Diagnosis not present

## 2017-04-09 DIAGNOSIS — I10 Essential (primary) hypertension: Secondary | ICD-10-CM | POA: Diagnosis not present

## 2017-04-09 DIAGNOSIS — R531 Weakness: Secondary | ICD-10-CM | POA: Diagnosis not present

## 2017-04-09 DIAGNOSIS — M7660 Achilles tendinitis, unspecified leg: Secondary | ICD-10-CM | POA: Diagnosis not present

## 2017-04-16 DIAGNOSIS — Z124 Encounter for screening for malignant neoplasm of cervix: Secondary | ICD-10-CM | POA: Diagnosis not present

## 2017-04-16 DIAGNOSIS — Z01419 Encounter for gynecological examination (general) (routine) without abnormal findings: Secondary | ICD-10-CM | POA: Diagnosis not present

## 2017-06-18 ENCOUNTER — Other Ambulatory Visit: Payer: Self-pay

## 2017-06-18 MED ORDER — SPIRONOLACTONE 25 MG PO TABS: 50.0000 mg | ORAL_TABLET | Freq: Every day | ORAL | 3 refills | Status: AC

## 2017-07-17 DIAGNOSIS — F321 Major depressive disorder, single episode, moderate: Secondary | ICD-10-CM | POA: Diagnosis not present

## 2017-07-20 IMAGING — CT CT CHEST W/O CM
2 of 3 series · 15 of 36 positions shown, 18 images · non-contrast
Comparison: None; correlation chest radiograph 08/25/2016

CLINICAL DATA: Recent cough, abnormal chest radiograph

EXAM:
CT CHEST WITHOUT CONTRAST
TECHNIQUE: Multidetector CT imaging of the chest was performed following the
standard protocol without IV contrast. Sagittal and coronal MPR
images reconstructed from axial data set.

[Series 2: thorax · axial · 0.74mm/px · z∈[-283,-29]mm · 12 of 151 slices shown, 15 images]
[im 12/151  mediastinal]
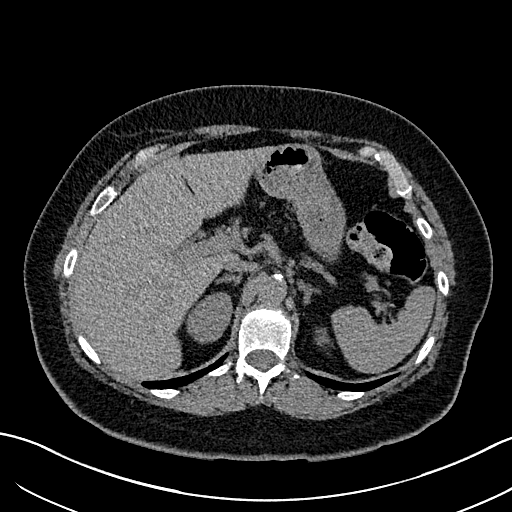
[im 12/151  lung]
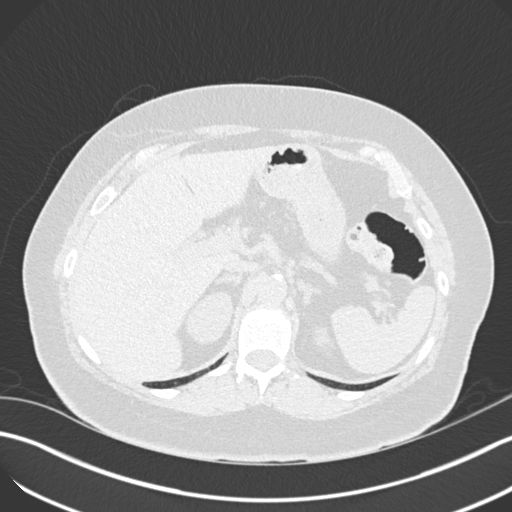
[im 23/151  lung]
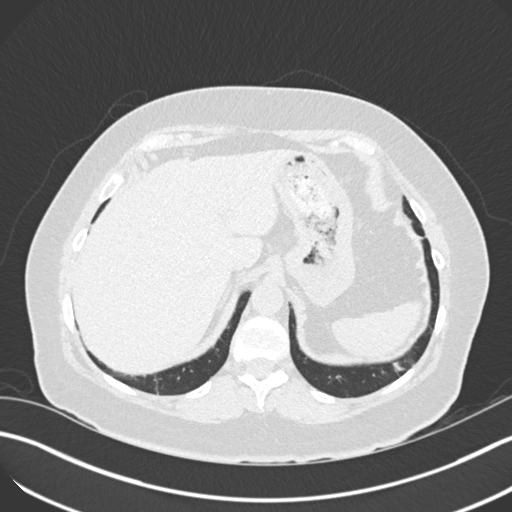
[im 34/151  lung]
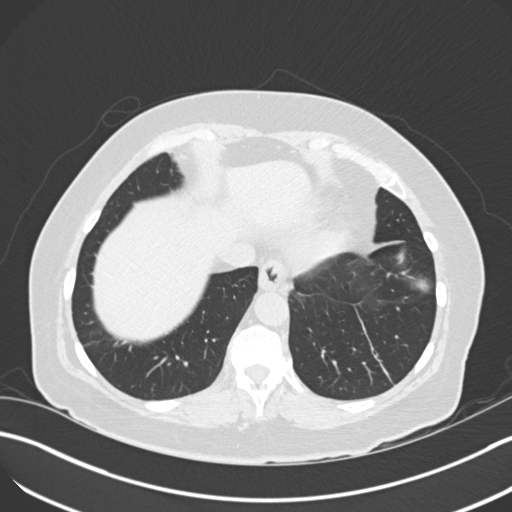
[im 45/151  lung]
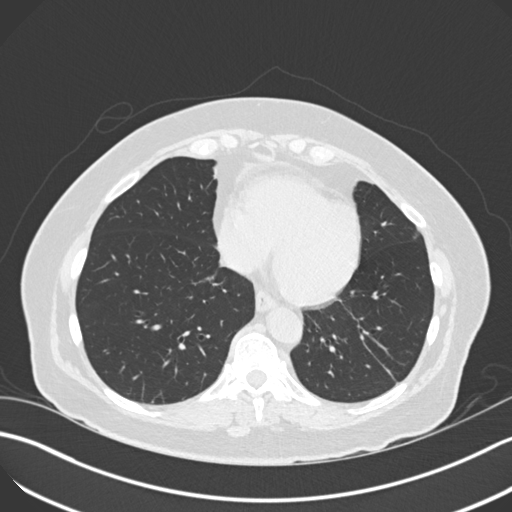
[im 56/151  mediastinal]
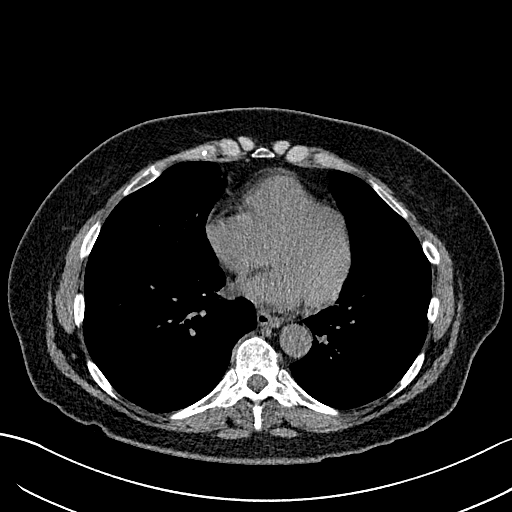
[im 56/151  lung]
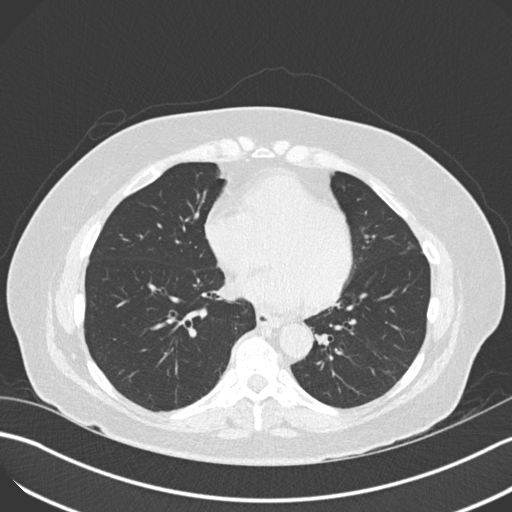
[im 67/151  lung]
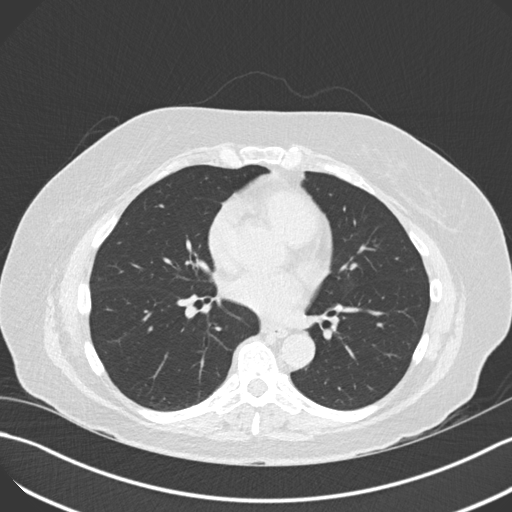
[im 84/151  lung]
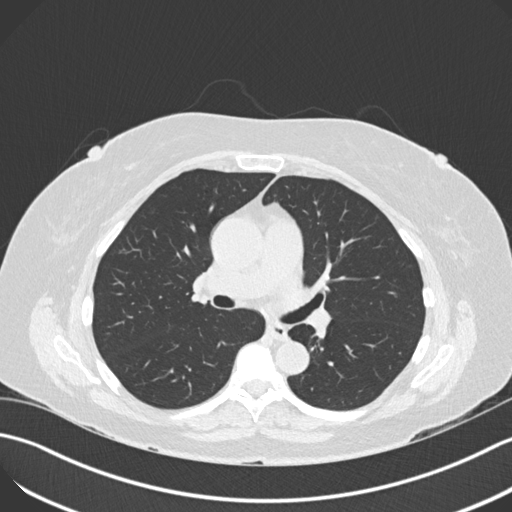
[im 95/151  lung]
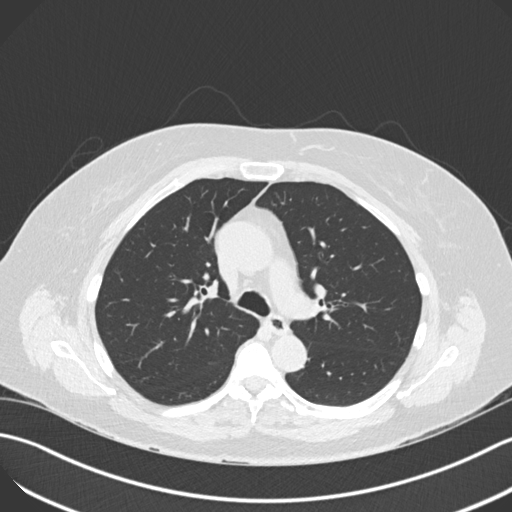
[im 106/151  mediastinal]
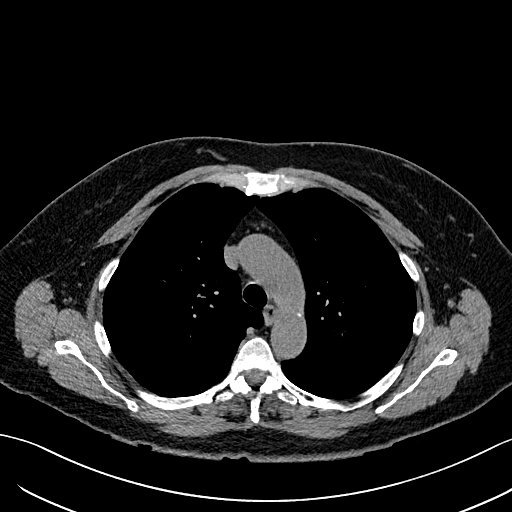
[im 106/151  lung]
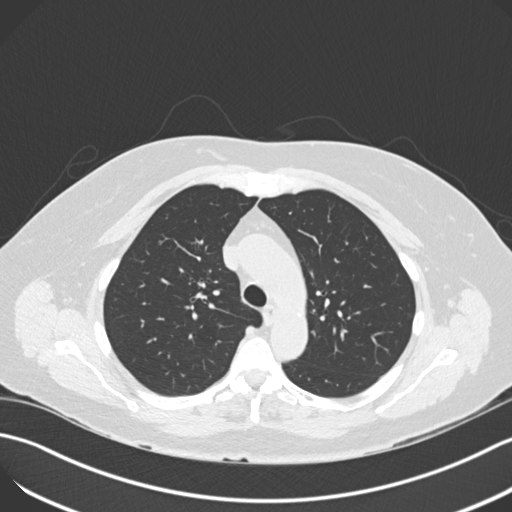
[im 117/151  lung]
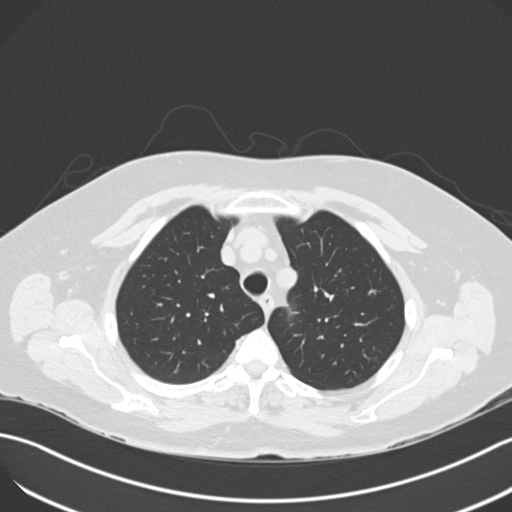
[im 128/151  lung]
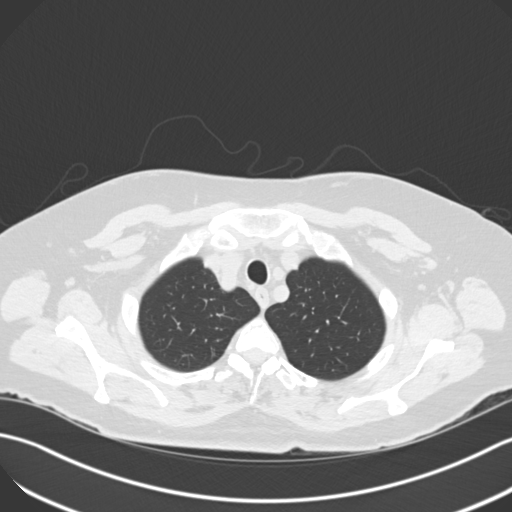
[im 139/151  lung]
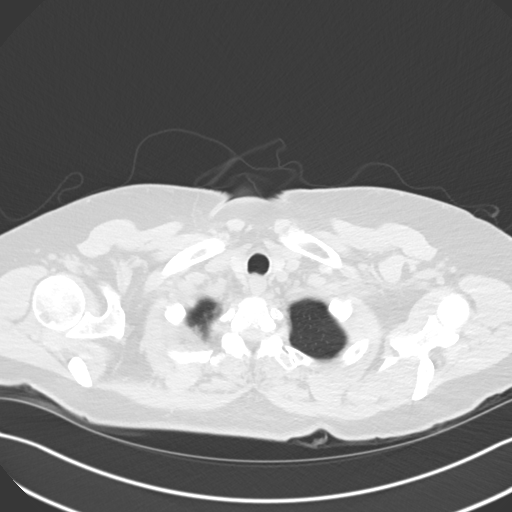

[Series 5: coronal · coronal · 0.59mm/px · 3 of 118 slices shown]
[im 24/118  lung]
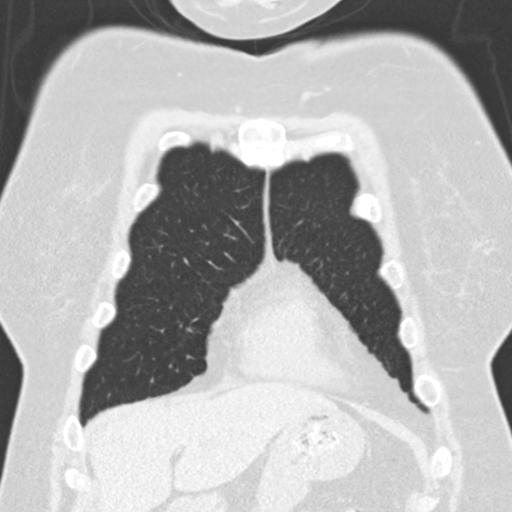
[im 47/118  lung]
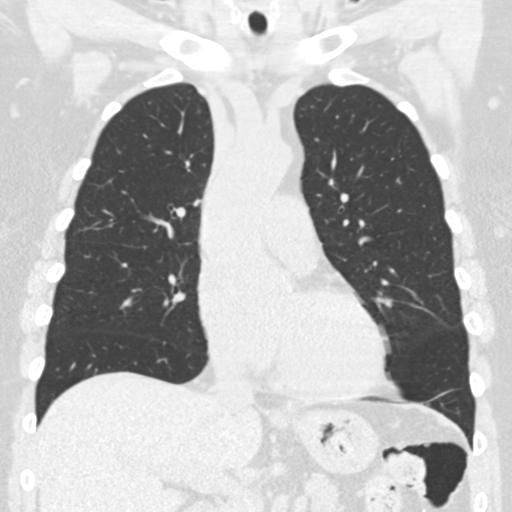
[im 71/118  lung]
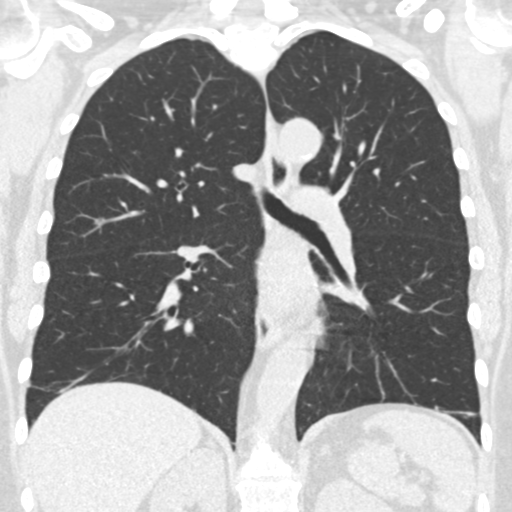

[15 of 36 positions shown; findings below may reference images not displayed]

FINDINGS: Cardiovascular: Mild atherosclerotic calcification aorta. Minimal
aneurysmal dilatation of the ascending thoracic aorta 4.1 cm
transverse image 68. No pericardial effusion.

Mediastinum/Nodes: Question tiny hiatal hernia. Remainder of
esophagus unremarkable. Base of cervical region unremarkable. Few
normal size mediastinal nodes without thoracic adenopathy.

Lungs/Pleura: Linear subsegmental atelectasis versus scarring LEFT
lower lobe. Multiple BILATERAL pulmonary nodules, not definitely
calcified, largest 6 mm diameter RIGHT lower lobe. No pulmonary
infiltrate, pleural effusion or pneumothorax.

Upper Abdomen: Unremarkable

Musculoskeletal: Unremarkable
IMPRESSION: Question tiny hiatal hernia.

Multiple BILATERAL pulmonary nodules up to 6 mm diameter RIGHT lower
lobe, recommendation below.

Non-contrast chest CT at 3-6 months is recommended. If the nodules
are stable at time of repeat CT, then future CT at 18-24 months
(from today's scan) is considered optional for low-risk patients,
but is recommended for high-risk patients. This recommendation
follows the consensus statement: Guidelines for Management of
Incidental Pulmonary Nodules Detected on CT Images: From the

Aortic atherosclerosis with mild aneurysmal dilatation of the
ascending thoracic aorta 4.1 cm greatest diameter, recommendation
below.

Recommend annual imaging followup by CTA or MRA. This recommendation
follows 4535 ACCF/AHA/AATS/ACR/ASA/SCA/BLAIN/DANITZA/DGASGAGASFASFASGSAF/ELIAH Guidelines
for the Diagnosis and Management of Patients with Thoracic Aortic
Disease. Circulation. 4535; 121: [id number]

## 2017-07-24 DIAGNOSIS — F321 Major depressive disorder, single episode, moderate: Secondary | ICD-10-CM | POA: Diagnosis not present

## 2017-08-07 DIAGNOSIS — F341 Dysthymic disorder: Secondary | ICD-10-CM | POA: Diagnosis not present

## 2017-08-14 DIAGNOSIS — F341 Dysthymic disorder: Secondary | ICD-10-CM | POA: Diagnosis not present

## 2017-09-06 DIAGNOSIS — F341 Dysthymic disorder: Secondary | ICD-10-CM | POA: Diagnosis not present

## 2017-09-11 DIAGNOSIS — F341 Dysthymic disorder: Secondary | ICD-10-CM | POA: Diagnosis not present

## 2017-09-21 DIAGNOSIS — F341 Dysthymic disorder: Secondary | ICD-10-CM | POA: Diagnosis not present

## 2017-10-02 DIAGNOSIS — F341 Dysthymic disorder: Secondary | ICD-10-CM | POA: Diagnosis not present

## 2017-10-09 DIAGNOSIS — F341 Dysthymic disorder: Secondary | ICD-10-CM | POA: Diagnosis not present

## 2017-10-15 DIAGNOSIS — D225 Melanocytic nevi of trunk: Secondary | ICD-10-CM | POA: Diagnosis not present

## 2017-10-15 DIAGNOSIS — L820 Inflamed seborrheic keratosis: Secondary | ICD-10-CM | POA: Diagnosis not present

## 2017-10-15 DIAGNOSIS — D2239 Melanocytic nevi of other parts of face: Secondary | ICD-10-CM | POA: Diagnosis not present

## 2017-10-15 DIAGNOSIS — D2261 Melanocytic nevi of right upper limb, including shoulder: Secondary | ICD-10-CM | POA: Diagnosis not present

## 2017-10-16 DIAGNOSIS — F341 Dysthymic disorder: Secondary | ICD-10-CM | POA: Diagnosis not present

## 2017-10-30 DIAGNOSIS — F341 Dysthymic disorder: Secondary | ICD-10-CM | POA: Diagnosis not present

## 2017-11-06 DIAGNOSIS — F341 Dysthymic disorder: Secondary | ICD-10-CM | POA: Diagnosis not present

## 2017-11-13 DIAGNOSIS — F341 Dysthymic disorder: Secondary | ICD-10-CM | POA: Diagnosis not present

## 2017-11-20 DIAGNOSIS — F341 Dysthymic disorder: Secondary | ICD-10-CM | POA: Diagnosis not present

## 2017-11-27 DIAGNOSIS — F341 Dysthymic disorder: Secondary | ICD-10-CM | POA: Diagnosis not present

## 2017-12-04 DIAGNOSIS — F341 Dysthymic disorder: Secondary | ICD-10-CM | POA: Diagnosis not present

## 2017-12-06 ENCOUNTER — Ambulatory Visit: Payer: MEDICARE

## 2017-12-06 DIAGNOSIS — H40003 Preglaucoma, unspecified, bilateral: Secondary | ICD-10-CM

## 2017-12-06 DIAGNOSIS — Z961 Presence of intraocular lens: Secondary | ICD-10-CM

## 2017-12-06 NOTE — Progress Notes
Assessment and Plan     Problem List        Eye / Vision Problems    1. Glaucoma suspect, both eyes     Overview      [place]    Mother with glaucoma - good vision  Cupping with asymmetry of disc and cup  [phone] - no history of corneal surgery  12/06/2017 Optic nerve OCT (optical coherence tomography) consistent with   physiologic cupping in each eye.    HEP - [employer] [facility]              2. Pseudophakia of both eyes - [place]             Orders:  Orders Placed This Encounter    OCT OPTIC NERVE HEIDELBERG OU (BOTH EYES)     Technician Instructions:     Scheduling Instructions:      Patient Instructions:    PACHYMETRY OU (BOTH EYES)     Technician Instructions:     Scheduling Instructions:      Patient Instructions:       Follow ups:  Return in about 2 months (around 02/06/2018) for Intraocular pressure check, HEP.       I discussed the above assessment and plan with the patient. She had the opportunity to ask questions, and her questions and concerns were addressed. She was reminded to call if there is any significant change or worsening in vision, or to get an evaluation, urgently if appropriate.    Author:   Colen DarlingGavin G. Deago Burruss, MD  This note details the assessment and plan of the encounter for this date. For a complete note and record of the encounter, please see the Encounter Summary.

## 2017-12-20 DIAGNOSIS — F341 Dysthymic disorder: Secondary | ICD-10-CM | POA: Diagnosis not present

## 2017-12-25 DIAGNOSIS — F341 Dysthymic disorder: Secondary | ICD-10-CM | POA: Diagnosis not present

## 2018-01-01 DIAGNOSIS — F341 Dysthymic disorder: Secondary | ICD-10-CM | POA: Diagnosis not present

## 2018-01-07 DIAGNOSIS — M1990 Unspecified osteoarthritis, unspecified site: Secondary | ICD-10-CM | POA: Diagnosis not present

## 2018-01-07 DIAGNOSIS — I10 Essential (primary) hypertension: Secondary | ICD-10-CM | POA: Diagnosis not present

## 2018-01-07 DIAGNOSIS — R911 Solitary pulmonary nodule: Secondary | ICD-10-CM | POA: Diagnosis not present

## 2018-01-07 DIAGNOSIS — E782 Mixed hyperlipidemia: Secondary | ICD-10-CM | POA: Diagnosis not present

## 2018-01-08 DIAGNOSIS — F341 Dysthymic disorder: Secondary | ICD-10-CM | POA: Diagnosis not present

## 2018-01-15 DIAGNOSIS — F341 Dysthymic disorder: Secondary | ICD-10-CM | POA: Diagnosis not present

## 2018-01-22 DIAGNOSIS — F341 Dysthymic disorder: Secondary | ICD-10-CM | POA: Diagnosis not present

## 2018-01-29 DIAGNOSIS — F341 Dysthymic disorder: Secondary | ICD-10-CM | POA: Diagnosis not present

## 2018-02-05 DIAGNOSIS — F341 Dysthymic disorder: Secondary | ICD-10-CM | POA: Diagnosis not present

## 2018-02-12 ENCOUNTER — Ambulatory Visit: Payer: PRIVATE HEALTH INSURANCE

## 2018-02-12 DIAGNOSIS — H40003 Preglaucoma, unspecified, bilateral: Secondary | ICD-10-CM | POA: Diagnosis not present

## 2018-02-12 DIAGNOSIS — F341 Dysthymic disorder: Secondary | ICD-10-CM | POA: Diagnosis not present

## 2018-02-12 NOTE — Progress Notes
Assessment and Plan     Problem List        Eye / Vision Problems    1. Glaucoma suspect, both eyes     Overview      Moved her from [place] in 2018    Mother with Parkinson's - good vision  Cupping with asymmetry of disc and cup  495 490 - no history of corneal surgery  12/06/2017 Optic nerve OCT (optical coherence tomography) consistent with   physiologic cupping in each eye.    HEP - [employer] - without significant findings.     Risk of glaucoma discussed. Patient understands the need for periodic   screening.                        Orders:  Orders Placed This Encounter    HEIDELBERG EDGE PERIMETRY OU - BOTH EYES     Technician Instructions:     Scheduling Instructions:      Patient Instructions:       Follow ups:  Return in about 1 year (around 02/12/2019) for HEP, Optic nerve OCT, Fundus photos.       I discussed the above assessment and plan with the patient. She had the opportunity to ask questions, and her questions and concerns were addressed. She was reminded to call if there is any significant change or worsening in vision, or to get an evaluation, urgently if appropriate.    Author:   Colen Darling, MD  This note details the assessment and plan of the encounter for this date. For a complete note and record of the encounter, please see the Encounter Summary.

## 2018-02-19 DIAGNOSIS — F341 Dysthymic disorder: Secondary | ICD-10-CM | POA: Diagnosis not present

## 2018-02-26 DIAGNOSIS — F341 Dysthymic disorder: Secondary | ICD-10-CM | POA: Diagnosis not present

## 2018-03-05 DIAGNOSIS — F341 Dysthymic disorder: Secondary | ICD-10-CM | POA: Diagnosis not present

## 2018-03-12 ENCOUNTER — Encounter: Payer: Self-pay | Admitting: Gastroenterology

## 2018-03-12 DIAGNOSIS — F341 Dysthymic disorder: Secondary | ICD-10-CM | POA: Diagnosis not present

## 2018-03-12 DIAGNOSIS — Z1231 Encounter for screening mammogram for malignant neoplasm of breast: Secondary | ICD-10-CM | POA: Diagnosis not present

## 2018-03-19 DIAGNOSIS — F341 Dysthymic disorder: Secondary | ICD-10-CM | POA: Diagnosis not present

## 2018-03-26 DIAGNOSIS — F341 Dysthymic disorder: Secondary | ICD-10-CM | POA: Diagnosis not present

## 2018-04-02 DIAGNOSIS — F341 Dysthymic disorder: Secondary | ICD-10-CM | POA: Diagnosis not present

## 2018-04-03 DIAGNOSIS — R911 Solitary pulmonary nodule: Secondary | ICD-10-CM | POA: Diagnosis not present

## 2018-04-04 DIAGNOSIS — E782 Mixed hyperlipidemia: Secondary | ICD-10-CM | POA: Diagnosis not present

## 2018-04-04 DIAGNOSIS — Z0000 Encounter for general adult medical examination without abnormal findings: Secondary | ICD-10-CM | POA: Diagnosis not present

## 2018-04-04 DIAGNOSIS — R739 Hyperglycemia, unspecified: Secondary | ICD-10-CM | POA: Diagnosis not present

## 2018-04-04 DIAGNOSIS — R079 Chest pain, unspecified: Secondary | ICD-10-CM | POA: Diagnosis not present

## 2018-04-04 DIAGNOSIS — I10 Essential (primary) hypertension: Secondary | ICD-10-CM | POA: Diagnosis not present

## 2018-04-04 DIAGNOSIS — Z780 Asymptomatic menopausal state: Secondary | ICD-10-CM | POA: Diagnosis not present

## 2018-04-09 DIAGNOSIS — F341 Dysthymic disorder: Secondary | ICD-10-CM | POA: Diagnosis not present

## 2018-04-16 DIAGNOSIS — F341 Dysthymic disorder: Secondary | ICD-10-CM | POA: Diagnosis not present

## 2018-04-23 DIAGNOSIS — F341 Dysthymic disorder: Secondary | ICD-10-CM | POA: Diagnosis not present

## 2018-04-30 DIAGNOSIS — F341 Dysthymic disorder: Secondary | ICD-10-CM | POA: Diagnosis not present

## 2018-05-06 DIAGNOSIS — F341 Dysthymic disorder: Secondary | ICD-10-CM | POA: Diagnosis not present

## 2018-05-14 DIAGNOSIS — F341 Dysthymic disorder: Secondary | ICD-10-CM | POA: Diagnosis not present

## 2018-05-21 DIAGNOSIS — F341 Dysthymic disorder: Secondary | ICD-10-CM | POA: Diagnosis not present

## 2018-05-28 DIAGNOSIS — F341 Dysthymic disorder: Secondary | ICD-10-CM | POA: Diagnosis not present

## 2018-06-04 DIAGNOSIS — F341 Dysthymic disorder: Secondary | ICD-10-CM | POA: Diagnosis not present

## 2018-06-11 DIAGNOSIS — F341 Dysthymic disorder: Secondary | ICD-10-CM | POA: Diagnosis not present

## 2018-06-18 DIAGNOSIS — F341 Dysthymic disorder: Secondary | ICD-10-CM | POA: Diagnosis not present

## 2018-06-25 DIAGNOSIS — F341 Dysthymic disorder: Secondary | ICD-10-CM | POA: Diagnosis not present

## 2018-07-02 DIAGNOSIS — F341 Dysthymic disorder: Secondary | ICD-10-CM | POA: Diagnosis not present

## 2018-07-11 DIAGNOSIS — F341 Dysthymic disorder: Secondary | ICD-10-CM | POA: Diagnosis not present

## 2019-02-11 ENCOUNTER — Ambulatory Visit: Payer: PRIVATE HEALTH INSURANCE

## 2019-02-11 ENCOUNTER — Ambulatory Visit: Payer: MEDICARE

## 2019-02-11 DIAGNOSIS — H40003 Preglaucoma, unspecified, bilateral: Secondary | ICD-10-CM

## 2019-02-28 ENCOUNTER — Ambulatory Visit: Payer: MEDICARE

## 2019-02-28 NOTE — Progress Notes
Assessment and Plan     Problem List        Eye / Vision Problems    1. Glaucoma suspect, both eyes     Overview      Moved her from [place] in 2018  495 490 - no history of corneal surgery  Long history of cupping.  Mother with Parkinson's - good vision  Cupping with asymmetry of disc and cup    12/06/2017 Optic nerve OCT (optical coherence tomography) consistent with   physiologic cupping in each eye.    HEP - [employer] - without significant findings.     Risk of glaucoma discussed. Patient understands the need for periodic   screening.    02/28/2019 HEP - [employer] Perimetry within normal limits, Nerve   OCTs stable                        Orders:  Orders Placed This Encounter    HEIDELBERG EDGE PERIMETRY OU - BOTH EYES     Technician Instructions:     Scheduling Instructions:      Patient Instructions:    OCT OPTIC NERVE HEIDELBERG OU (BOTH EYES)     Technician Instructions:     Scheduling Instructions:      Patient Instructions:       Follow ups:  Return in about 1 year (around 02/28/2020) for Intraocular pressure check, OPTIC NERVE OCT, HEP.         I discussed the above assessment and plan with the patient. She had the opportunity to ask questions, and her questions and concerns were addressed. She was reminded to call if there is any significant change or worsening in vision, or to get an evaluation, urgently if appropriate.    Author:   Colen Darling, MD  This note details the assessment and plan of the encounter for this date. For a complete note and record of the encounter, please see the Encounter Summary.

## 2020-01-28 ENCOUNTER — Ambulatory Visit: Payer: MEDICARE

## 2020-01-28 DIAGNOSIS — Z23 Encounter for immunization: Secondary | ICD-10-CM

## 2020-02-23 ENCOUNTER — Ambulatory Visit: Payer: PRIVATE HEALTH INSURANCE

## 2020-03-02 ENCOUNTER — Ambulatory Visit: Payer: PRIVATE HEALTH INSURANCE

## 2020-03-11 ENCOUNTER — Ambulatory Visit: Payer: PRIVATE HEALTH INSURANCE

## 2020-03-11 NOTE — Progress Notes
Assessment and Plan     Problem List        Eye / Vision Problems    1. Glaucoma suspect, both eyes     Overview      Moved here from [place] in 2018  495 490 - no history of corneal surgery  Long history of cupping.  Mother with Parkinson's - good vision  Cupping with asymmetry of disc and cup - possibly physiologic    12/06/2017 Optic nerve OCT (optical coherence tomography) consistent with   physiologic cupping in each eye.    HEP - [employer] - without significant findings.     Risk of glaucoma discussed. Patient understands the need for periodic   screening.    02/28/2019 HEP - [employer] Perimetry within normal limits, Nerve   OCTs stable  03/11/2020 Recheck 1 year with visual field and posterior pole OCT                      Orders:  Orders Placed This Encounter    VISUAL FIELD 24-2 OU (BOTH EYES)     Technician Instructions:     Scheduling Instructions:      Patient Instructions:    OCT POSTERIOR POLE OU (BOTH EYES)     Technician Instructions:     Scheduling Instructions:      Patient Instructions:       Follow ups:  Return in about 1 year (around 03/11/2021) for Visual field, Posterior pole OCT.         I discussed the above assessment and plan with the patient. She had the opportunity to ask questions, and her questions and concerns were addressed. She was reminded to call if there is any significant change or worsening in vision, or to get an evaluation, urgently if appropriate.    Author:   Colen Darling, MD  This note details the assessment and plan of the encounter for this date. For a complete note and record of the encounter, please see the Encounter Summary.

## 2021-03-10 ENCOUNTER — Ambulatory Visit: Payer: PRIVATE HEALTH INSURANCE

## 2021-03-10 DIAGNOSIS — H26491 Other secondary cataract, right eye: Secondary | ICD-10-CM

## 2021-03-10 NOTE — Progress Notes
Assessment and Plan     Problem List        Eye / Vision Problems    1. Glaucoma suspect, both eyes     Overview      Moved here from [place] in 2018  [phone] - no history of corneal surgery  Long history of cupping.  Mother with Parkinson's - good vision  Cupping with asymmetry of disc and cup - possibly physiologic    12/06/2017 Optic nerve OCT (optical coherence tomography) consistent with   physiologic cupping in each eye.    HEP - [employer] - without significant findings.     Risk of glaucoma discussed. Patient understands the need for periodic   screening.    02/28/2019 HEP - [facility] Perimetry within normal limits, Nerve   OCTs stable  03/11/2020 Recheck 1 year with visual field and posterior pole OCT         2. PCO (posterior capsular opacification), right     Overview      Follow and monitor.                       Orders:  Orders Placed This Encounter    VISUAL FIELD 24-2 OU (BOTH EYES)     Technician Instructions:     Scheduling Instructions:      Patient Instructions:    OCT POSTERIOR POLE OU (BOTH EYES)     Technician Instructions:     Scheduling Instructions:      Patient Instructions:       Follow ups:  Return in about 1 year (around 03/10/2022) for Posterior Pole OCT, Visual field.         I discussed the above assessment and plan with the patient. She had the opportunity to ask questions, and her questions and concerns were addressed. She was reminded to call if there is any significant change or worsening in vision, or to get an evaluation, urgently if appropriate.    Author:   Colen Darling, MD  This note details the assessment and plan of the encounter for this date. For a complete note and record of the encounter, please see the Encounter Summary.

## 2021-06-13 ENCOUNTER — Ambulatory Visit: Payer: PRIVATE HEALTH INSURANCE

## 2022-03-02 ENCOUNTER — Ambulatory Visit: Payer: MEDICARE

## 2022-03-09 ENCOUNTER — Non-Acute Institutional Stay: Payer: MEDICARE

## 2022-03-09 ENCOUNTER — Non-Acute Institutional Stay: Payer: PRIVATE HEALTH INSURANCE
